# Patient Record
Sex: Female | Born: 1945 | Race: White | Hispanic: No | Marital: Married | Smoking: Never smoker
Health system: Southern US, Community
[De-identification: ages and names within clinical notes are randomized; demographics above are authoritative.]

## PROBLEM LIST (undated history)

## (undated) DIAGNOSIS — I10 Essential (primary) hypertension: Secondary | ICD-10-CM

## (undated) DIAGNOSIS — J309 Allergic rhinitis, unspecified: Secondary | ICD-10-CM

## (undated) DIAGNOSIS — M1990 Unspecified osteoarthritis, unspecified site: Secondary | ICD-10-CM

## (undated) HISTORY — DX: Essential (primary) hypertension: I10

## (undated) HISTORY — DX: Unspecified osteoarthritis, unspecified site: M19.90

## (undated) HISTORY — DX: Allergic rhinitis, unspecified: J30.9

## (undated) HISTORY — PX: DILATION AND CURETTAGE OF UTERUS: SHX78

---

## 1998-11-10 ENCOUNTER — Other Ambulatory Visit: Admission: RE | Admit: 1998-11-10 | Discharge: 1998-11-10 | Payer: Self-pay | Admitting: *Deleted

## 2002-07-15 ENCOUNTER — Encounter: Admission: RE | Admit: 2002-07-15 | Discharge: 2002-07-15 | Payer: Self-pay | Admitting: *Deleted

## 2002-07-15 ENCOUNTER — Encounter: Payer: Self-pay | Admitting: *Deleted

## 2002-07-16 ENCOUNTER — Other Ambulatory Visit: Admission: RE | Admit: 2002-07-16 | Discharge: 2002-07-16 | Payer: Self-pay | Admitting: *Deleted

## 2002-08-19 ENCOUNTER — Other Ambulatory Visit: Admission: RE | Admit: 2002-08-19 | Discharge: 2002-08-19 | Payer: Self-pay | Admitting: *Deleted

## 2006-11-08 ENCOUNTER — Ambulatory Visit: Payer: Self-pay | Admitting: Family Medicine

## 2006-11-08 LAB — CONVERTED CEMR LAB
BUN: 9 mg/dL (ref 6–23)
CO2: 26 meq/L (ref 19–32)
Calcium: 9.9 mg/dL (ref 8.4–10.5)
Chloride: 103 meq/L (ref 96–112)
Chol/HDL Ratio, serum: 5.1
Cholesterol: 256 mg/dL (ref 0–200)
Creatinine, Ser: 0.8 mg/dL (ref 0.4–1.2)
GFR calc non Af Amer: 78 mL/min
Glomerular Filtration Rate, Af Am: 95 mL/min/{1.73_m2}
Glucose, Bld: 87 mg/dL (ref 70–99)
HDL: 50.1 mg/dL (ref >39.0)
LDL DIRECT: 174.8 mg/dL
Potassium: 4 meq/L (ref 3.5–5.1)
Sodium: 138 meq/L (ref 135–145)
Triglyceride fasting, serum: 141 mg/dL (ref 0–149)
VLDL: 28 mg/dL (ref 0–40)

## 2007-11-06 ENCOUNTER — Telehealth (INDEPENDENT_AMBULATORY_CARE_PROVIDER_SITE_OTHER): Payer: Self-pay | Admitting: *Deleted

## 2007-11-16 ENCOUNTER — Telehealth (INDEPENDENT_AMBULATORY_CARE_PROVIDER_SITE_OTHER): Payer: Self-pay | Admitting: *Deleted

## 2007-11-16 ENCOUNTER — Ambulatory Visit: Payer: Self-pay | Admitting: Family Medicine

## 2007-11-16 DIAGNOSIS — I10 Essential (primary) hypertension: Secondary | ICD-10-CM | POA: Insufficient documentation

## 2007-11-16 DIAGNOSIS — M1990 Unspecified osteoarthritis, unspecified site: Secondary | ICD-10-CM | POA: Insufficient documentation

## 2007-11-16 DIAGNOSIS — E782 Mixed hyperlipidemia: Secondary | ICD-10-CM | POA: Insufficient documentation

## 2007-11-16 LAB — CONVERTED CEMR LAB
ALT: 25 units/L (ref 0–35)
AST: 23 units/L (ref 0–37)
BUN: 12 mg/dL (ref 6–23)
CO2: 26 meq/L (ref 19–32)
Calcium: 9.5 mg/dL (ref 8.4–10.5)
Chloride: 105 meq/L (ref 96–112)
Cholesterol: 240 mg/dL (ref 0–200)
Creatinine, Ser: 0.8 mg/dL (ref 0.4–1.2)
Direct LDL: 162.7 mg/dL
GFR calc Af Amer: 94 mL/min
GFR calc non Af Amer: 78 mL/min
Glucose, Bld: 101 mg/dL — ABNORMAL HIGH (ref 70–99)
HDL: 38.3 mg/dL — ABNORMAL LOW (ref >39.0)
Potassium: 4 meq/L (ref 3.5–5.1)
Sodium: 139 meq/L (ref 135–145)
Total CHOL/HDL Ratio: 6.3
Triglycerides: 129 mg/dL (ref 0–149)
VLDL: 26 mg/dL (ref 0–40)

## 2008-01-23 ENCOUNTER — Ambulatory Visit: Payer: Self-pay | Admitting: Family Medicine

## 2008-01-29 DIAGNOSIS — R7309 Other abnormal glucose: Secondary | ICD-10-CM | POA: Insufficient documentation

## 2008-01-29 LAB — CONVERTED CEMR LAB
ALT: 23 units/L (ref 0–35)
AST: 21 units/L (ref 0–37)
BUN: 12 mg/dL (ref 6–23)
CO2: 28 meq/L (ref 19–32)
Calcium: 9.7 mg/dL (ref 8.4–10.5)
Chloride: 105 meq/L (ref 96–112)
Cholesterol: 225 mg/dL (ref 0–200)
Creatinine, Ser: 0.9 mg/dL (ref 0.4–1.2)
Direct LDL: 152.4 mg/dL
GFR calc Af Amer: 82 mL/min
GFR calc non Af Amer: 68 mL/min
Glucose, Bld: 114 mg/dL — ABNORMAL HIGH (ref 70–99)
HDL: 42.8 mg/dL (ref >39.0)
Potassium: 3.9 meq/L (ref 3.5–5.1)
Sodium: 140 meq/L (ref 135–145)
Total CHOL/HDL Ratio: 5.3
Triglycerides: 170 mg/dL — ABNORMAL HIGH (ref 0–149)
VLDL: 34 mg/dL (ref 0–40)

## 2008-01-30 ENCOUNTER — Encounter (INDEPENDENT_AMBULATORY_CARE_PROVIDER_SITE_OTHER): Payer: Self-pay | Admitting: *Deleted

## 2008-02-01 ENCOUNTER — Telehealth (INDEPENDENT_AMBULATORY_CARE_PROVIDER_SITE_OTHER): Payer: Self-pay | Admitting: *Deleted

## 2012-01-18 ENCOUNTER — Ambulatory Visit (INDEPENDENT_AMBULATORY_CARE_PROVIDER_SITE_OTHER): Payer: 59 | Admitting: Family Medicine

## 2012-01-18 VITALS — BP 143/79 | HR 82 | Temp 98.0°F | Resp 16 | Ht 60.0 in | Wt 177.0 lb

## 2012-01-18 DIAGNOSIS — R0982 Postnasal drip: Secondary | ICD-10-CM

## 2012-01-18 DIAGNOSIS — E7800 Pure hypercholesterolemia, unspecified: Secondary | ICD-10-CM

## 2012-01-18 DIAGNOSIS — I10 Essential (primary) hypertension: Secondary | ICD-10-CM

## 2012-01-18 LAB — POCT CBC
Granulocyte percent: 55.8 %G (ref 37–80)
HCT, POC: 46.6 % (ref 37.7–47.9)
Hemoglobin: 15.2 g/dL (ref 12.2–16.2)
Lymph, poc: 3.1 (ref 0.6–3.4)
MCH, POC: 29.2 pg (ref 27–31.2)
MCHC: 32.6 g/dL (ref 31.8–35.4)
MCV: 89.7 fL (ref 80–97)
MPV: 10 fL (ref 0–99.8)
POC Granulocyte: 4.7 (ref 2–6.9)
POC LYMPH PERCENT: 36.9 %L (ref 10–50)
POC MID %: 7.3 %M (ref 0–12)
RBC: 5.2 M/uL (ref 4.04–5.48)
RDW, POC: 14 %
WBC: 8.5 10*3/uL (ref 4.6–10.2)

## 2012-01-18 LAB — LIPID PANEL
Cholesterol: 263 mg/dL — ABNORMAL HIGH (ref 0–200)
HDL: 54 mg/dL (ref >39)
LDL Cholesterol: 176 mg/dL — ABNORMAL HIGH (ref 0–99)
Total CHOL/HDL Ratio: 4.9 Ratio
Triglycerides: 164 mg/dL — ABNORMAL HIGH (ref <150)
VLDL: 33 mg/dL (ref 0–40)

## 2012-01-18 LAB — COMPREHENSIVE METABOLIC PANEL
ALT: 33 U/L (ref 0–35)
AST: 40 U/L — ABNORMAL HIGH (ref 0–37)
Albumin: 4.3 g/dL (ref 3.5–5.2)
Alkaline Phosphatase: 92 U/L (ref 39–117)
CO2: 24 mEq/L (ref 19–32)
Calcium: 9.7 mg/dL (ref 8.4–10.5)
Chloride: 106 mEq/L (ref 96–112)
Creat: 0.89 mg/dL (ref 0.50–1.10)
Glucose, Bld: 104 mg/dL — ABNORMAL HIGH (ref 70–99)
Potassium: 4.3 mEq/L (ref 3.5–5.3)
Sodium: 142 mEq/L (ref 135–145)
Total Bilirubin: 0.7 mg/dL (ref 0.3–1.2)
Total Protein: 7.2 g/dL (ref 6.0–8.3)

## 2012-01-18 MED ORDER — IPRATROPIUM BROMIDE 0.03 % NA SOLN: 2.0000 | Freq: Four times a day (QID) | NASAL | Status: DC

## 2012-01-18 MED ORDER — LISINOPRIL 40 MG PO TABS: 40.0000 mg | ORAL_TABLET | Freq: Every day | ORAL | Status: DC

## 2012-01-18 NOTE — Progress Notes (Signed)
  Patient Name: Sally Bautista Date of Birth: Jul 30, 1946 Medical Record Number: [id number] Gender: female Date of Encounter: 01/18/2012  History of Present Illness:  Sally Bautista is a 66 y.o. very pleasant female patient who presents with the following:  Here for BP check and labs/ medication refill.  Has been taking her lisinopril regularly.  Does not do home checks.  Did take her medication today, but she is otherwise fasting today.  Does also have complaint of "constant sinus drainage."  She has tried saline spray OTC, hot tea.  Notes that her nose is stuffy and she has PND  She is not taking cholesterol medicaiton.  She has not been able to tolerate statins due to muscle aches.  She also has tried zetia but also had muscle pains.    Patient Active Problem List  Diagnoses  . HYPERLIPIDEMIA  . HYPERTENSION  . OSTEOARTHRITIS  . HYPERGLYCEMIA, FASTING   History reviewed. No pertinent past medical history. Past Surgical History  Procedure Date  . Cesarean section   . Dilation and curettage of uterus     times 2   History  Substance Use Topics  . Smoking status: Never Smoker   . Smokeless tobacco: Never Used  . Alcohol Use: No   Family History  Problem Relation Age of Onset  . Arthritis Mother   . Diabetes Brother   . Asthma Daughter    Allergies  Allergen Reactions  . Sulfonamide Derivatives     Medication list has been reviewed and updated.  Review of Systems: As per HPI- otherwise negative.  Physical Examination: Filed Vitals:   01/18/12 0853  BP: 143/79  Pulse: 82  Temp: 98 F (36.7 C)  Resp: 16  Height: 5' (1.524 m)  Weight: 177 lb (80.287 kg)    Body mass index is 34.57 kg/(m^2).  GEN: WDWN, NAD, Non-toxic, A & O x 3 HEENT: Atraumatic, Normocephalic. Neck supple. No masses, No LAD. Ears and Nose: No external deformity. CV: RRR, No M/G/R. No JVD. No thrill. No extra heart sounds. PULM: CTA B, no wheezes, crackles, rhonchi. No retractions. No resp.  distress. No accessory muscle use. ABD: S, NT, ND, +BS. No rebound. No HSM. EXTR: No c/c/e NEURO Normal gait.  PSYCH: Normally interactive. Conversant. Not depressed or anxious appearing.  Calm demeanor.    Assessment and Plan: 1. Hypertension  Comprehensive metabolic panel, POCT CBC, lisinopril (PRINIVIL,ZESTRIL) 40 MG tablet  2. Hypercholesterolemia  Lipid panel  3. Post-nasal drip  ipratropium (ATROVENT) 0.03 % nasal spray   She will try OTC niacin as she has been able to tolerate this in the past.  Further follow up pending labs

## 2012-01-19 ENCOUNTER — Encounter: Payer: Self-pay | Admitting: Family Medicine

## 2012-11-19 ENCOUNTER — Ambulatory Visit (INDEPENDENT_AMBULATORY_CARE_PROVIDER_SITE_OTHER): Payer: BC Managed Care – PPO | Admitting: Family Medicine

## 2012-11-19 VITALS — BP 168/76 | HR 77 | Temp 97.8°F | Resp 16 | Ht 60.0 in | Wt 175.0 lb

## 2012-11-19 DIAGNOSIS — J329 Chronic sinusitis, unspecified: Secondary | ICD-10-CM

## 2012-11-19 DIAGNOSIS — I10 Essential (primary) hypertension: Secondary | ICD-10-CM

## 2012-11-19 MED ORDER — AMOXICILLIN 500 MG PO CAPS: 1000.0000 mg | ORAL_CAPSULE | Freq: Two times a day (BID) | ORAL | Status: DC

## 2012-11-19 MED ORDER — LISINOPRIL 40 MG PO TABS: 40.0000 mg | ORAL_TABLET | Freq: Every day | ORAL | Status: DC

## 2012-11-19 NOTE — Patient Instructions (Addendum)
Use the autobiotic as directed.  Let me know if you are not better in the next few days- Sooner if worse.   Keep an eye on your blood pressure- you can check it at most drug stores.  If it is running more than 140/ 85 consistently please let me know

## 2012-11-19 NOTE — Progress Notes (Signed)
[facility] [address] [place] [address] [phone]  Date:  11/19/2012   Name:  Sally Bautista   DOB:  [birth year]   MRN:  [id number]  PCP:  Dois Davenport., MD    Chief Complaint: Cough and Nasal Congestion   History of Present Illness:  Sally Bautista is a 66 y.o. very pleasant female patient who presents with the following:  Here today for illness- she has noted sinus congestion for "several months" and she has tried several OTC medications.  She is blowing out thick mucus.  She just started coughing over the last couple of days and is coughing up some thick mucus.  She thinks the infection is draining down her throat.  She has not noted a fever but has had chills, and was sweating this morning.    No GI symptoms.   She has had a slight ST, ear congestion.    She has been exposed to sick people at home- grandchildren, DIL, brother.    She did start an OTC decongestant which she thinks has raised her BP  Patient Active Problem List  Diagnosis  . HYPERLIPIDEMIA  . HYPERTENSION  . OSTEOARTHRITIS  . HYPERGLYCEMIA, FASTING    Past Medical History  Diagnosis Date  . Allergic rhinitis, cause unspecified     Allergic rhinitis  . Arthritis   . HTN (hypertension)     Past Surgical History  Procedure Date  . Cesarean section   . Dilation and curettage of uterus     times 2    History  Substance Use Topics  . Smoking status: Never Smoker   . Smokeless tobacco: Never Used  . Alcohol Use: No    Family History  Problem Relation Age of Onset  . Arthritis Mother   . Diabetes Brother   . Asthma Daughter     Allergies  Allergen Reactions  . Codeine Nausea And Vomiting  . Sulfonamide Derivatives     Medication list has been reviewed and updated.  Current Outpatient Prescriptions on File Prior to Visit  Medication Sig Dispense Refill  . aspirin 81 MG tablet Take 81 mg by mouth daily.      . diphenhydrAMINE (BENADRYL) 25 mg capsule  Take 25 mg by mouth every 6 (six) hours as needed.      Marland Kitchen lisinopril (PRINIVIL,ZESTRIL) 40 MG tablet Take 1 tablet (40 mg total) by mouth daily.  90 tablet  3  . calcium carbonate (OS-CAL) 1250 MG chewable tablet Chew 1 tablet by mouth daily.      Marland Kitchen ipratropium (ATROVENT) 0.03 % nasal spray Place 2 sprays into the nose 4 (four) times daily.  30 mL  6    Review of Systems:  As per HPI- otherwise negative.   Physical Examination: Filed Vitals:   11/19/12 1004  BP: 168/76  Pulse: 77  Temp: 97.8 F (36.6 C)  Resp: 16   Filed Vitals:   11/19/12 1004  Height: 5' (1.524 m)  Weight: 175 lb (79.379 kg)   Body mass index is 34.18 kg/(m^2). Ideal Body Weight: Weight in (lb) to have BMI = 25: 127.7   GEN: WDWN, NAD, Non-toxic, A & O x 3 HEENT: Atraumatic, Normocephalic. Neck supple. No masses, No LAD. Bilateral TM wnl, oropharynx normal.  PEERL,EOMI.  She has nasal congestion and sinus tenderness  Ears and Nose: No external deformity. CV: RRR, No M/G/R. No JVD. No thrill. No extra heart sounds. PULM: CTA B, no wheezes, crackles,  rhonchi. No retractions. No resp. distress. No accessory muscle use. ABD: S, NT, ND, +BS. No rebound. No HSM. EXTR: No c/c/e NEURO Normal gait.  PSYCH: Normally interactive. Conversant. Not depressed or anxious appearing.  Calm demeanor.    Assessment and Plan: 1. Hypertension  lisinopril (PRINIVIL,ZESTRIL) 40 MG tablet  2. Sinusitis  amoxicillin (AMOXIL) 500 MG capsule   Treat for sinusitis as above.  Refilled her BP medication.  Her BP is up due to OTC decongestants.  She will monitor her BP- let me know if high persistently  See patient instructions for more details.    Meds ordered this encounter  Medications  . diphenhydrAMINE (BENADRYL) 25 mg capsule    Sig: Take 25 mg by mouth every 6 (six) hours as needed.  Marland Kitchen lisinopril (PRINIVIL,ZESTRIL) 40 MG tablet    Sig: Take 1 tablet (40 mg total) by mouth daily.    Dispense:  90 tablet    Refill:  3    . amoxicillin (AMOXIL) 500 MG capsule    Sig: Take 2 capsules (1,000 mg total) by mouth 2 (two) times daily.    Dispense:  40 capsule    Refill:  0     COPLAND,JESSICA, MD

## 2012-11-22 ENCOUNTER — Ambulatory Visit: Payer: BC Managed Care – PPO

## 2012-11-22 ENCOUNTER — Ambulatory Visit (INDEPENDENT_AMBULATORY_CARE_PROVIDER_SITE_OTHER): Payer: BC Managed Care – PPO | Admitting: Family Medicine

## 2012-11-22 VITALS — BP 165/85 | HR 89 | Temp 97.5°F | Resp 16 | Ht 60.0 in | Wt 173.0 lb

## 2012-11-22 DIAGNOSIS — R059 Cough, unspecified: Secondary | ICD-10-CM

## 2012-11-22 DIAGNOSIS — R062 Wheezing: Secondary | ICD-10-CM

## 2012-11-22 DIAGNOSIS — R05 Cough: Secondary | ICD-10-CM

## 2012-11-22 DIAGNOSIS — M4850XA Collapsed vertebra, not elsewhere classified, site unspecified, initial encounter for fracture: Secondary | ICD-10-CM

## 2012-11-22 DIAGNOSIS — J45909 Unspecified asthma, uncomplicated: Secondary | ICD-10-CM

## 2012-11-22 LAB — POCT CBC
Granulocyte percent: 54.4 %G (ref 37–80)
MCH, POC: 29.8 pg (ref 27–31.2)
MCV: 92.8 fL (ref 80–97)
MID (cbc): 0.7 (ref 0–0.9)
MPV: 10.3 fL (ref 0–99.8)
POC LYMPH PERCENT: 36.3 %L (ref 10–50)
POC MID %: 9.3 %M (ref 0–12)
Platelet Count, POC: 309 10*3/uL (ref 142–424)
RDW, POC: 12.4 %
WBC: 7.9 10*3/uL (ref 4.6–10.2)

## 2012-11-22 MED ORDER — ALBUTEROL SULFATE (2.5 MG/3ML) 0.083% IN NEBU: 2.5000 mg | INHALATION_SOLUTION | Freq: Once | RESPIRATORY_TRACT | Status: DC

## 2012-11-22 MED ORDER — ALBUTEROL SULFATE HFA 108 (90 BASE) MCG/ACT IN AERS: 2.0000 | INHALATION_SPRAY | Freq: Four times a day (QID) | RESPIRATORY_TRACT | Status: DC | PRN

## 2012-11-22 MED ORDER — PREDNISONE 20 MG PO TABS: ORAL_TABLET | ORAL | Status: DC

## 2012-11-22 MED ORDER — HYDROCODONE-HOMATROPINE 5-1.5 MG/5ML PO SYRP: 5.0000 mL | ORAL_SOLUTION | ORAL | Status: DC | PRN

## 2012-11-22 NOTE — Patient Instructions (Signed)
Drink lots of fluids  Use the inhaler 2 puffs every 4-6 hours as needed for wheezing and coughing. I would like you to use it faithfully for the first few days.  Take the cough syrup when needed, primarily at bedtime. It will make you drowsy in the daytime probably.  Continue the antibiotics

## 2012-11-22 NOTE — Addendum Note (Signed)
Addended by: Randell Teare H on: 11/22/2012 04:19 PM   Modules accepted: Orders

## 2012-11-22 NOTE — Progress Notes (Signed)
Subjective: Patient is here with a followup from her visit a few days ago. She saw Dr. Dallas Schimke and was treated with amoxicillin for a sinusitis. She is continued to have some drainage from her nose which is white to yellow to clear. Coughing a lot, especially at night. Last night she hardly got any sleep. She does not smoke. They steadfastly avoid perfume says her daughter has so much allergies. She works for [employer] for [employer] and works in the basement of [employer]. She has not been feverish.  Objective: Pleasant alert lady who looks a little uncomfortable. Her nose is red from blowing and wiping. More inflation the left nares than the right. Her TMs are normal. Her throat her neck was supple without nodes. Her chest has scattered wheezing bilaterally. Heart regular without murmurs.  Assessment: Asthmatic bronchitis Rhinosinusitis  Plan: Get peak flow Chest x-ray CBC Breathing treatment with albuterol  [facility] reading (PRIMARY) by  Dr. Marland Kitchenhopper Normal cxr  Peak flow only went from 280 up to 300 after albuterol treatment. However the lungs sound much better.  Will treat as an asthmatic bronchitis. Return if worse.
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
## 2012-12-05 ENCOUNTER — Ambulatory Visit (INDEPENDENT_AMBULATORY_CARE_PROVIDER_SITE_OTHER): Payer: BC Managed Care – PPO | Admitting: Family Medicine

## 2012-12-05 VITALS — BP 136/74 | HR 74 | Temp 98.0°F | Resp 18 | Ht 60.0 in | Wt 174.0 lb

## 2012-12-05 DIAGNOSIS — R059 Cough, unspecified: Secondary | ICD-10-CM

## 2012-12-05 DIAGNOSIS — J45909 Unspecified asthma, uncomplicated: Secondary | ICD-10-CM

## 2012-12-05 DIAGNOSIS — R05 Cough: Secondary | ICD-10-CM

## 2012-12-05 DIAGNOSIS — J019 Acute sinusitis, unspecified: Secondary | ICD-10-CM

## 2012-12-05 DIAGNOSIS — J40 Bronchitis, not specified as acute or chronic: Secondary | ICD-10-CM

## 2012-12-05 MED ORDER — MOXIFLOXACIN HCL 400 MG PO TABS: 400.0000 mg | ORAL_TABLET | Freq: Every day | ORAL | Status: DC

## 2012-12-05 NOTE — Progress Notes (Signed)
[facility]:  Office Visit  Chief Complaint:  Chief Complaint  Patient presents with  . Follow-up    from 11/22/12- Pt felt a little better and is now getting worse    HPI: Sally Bautista is a 67 y.o. female who complains of  sxs of a  sinus infection. She was recently treated on 12/30 with amoxacillin for acute sinusitis and then return on 1/2 for asthmatic bronchitis and was treated with prednisone and inhaler.. She states she is still having  thick green drainage from her nose, coughing up thick green to clear . Sinus pressure. Had mild HA today but now gone. Using Afrin, Simply Saline. Mucinex. No fevers, + chills.   Past Medical History  Diagnosis Date  . Allergic rhinitis, cause unspecified     Allergic rhinitis  . Arthritis   . HTN (hypertension)    Past Surgical History  Procedure Date  . Cesarean section   . Dilation and curettage of uterus     times 2   History   Social History  . Marital Status: Married    Spouse Name: N/A    Number of Children: N/A  . Years of Education: N/A   Social History Main Topics  . Smoking status: Never Smoker   . Smokeless tobacco: Never Used  . Alcohol Use: No  . Drug Use: No  . Sexually Active: None   Other Topics Concern  . None   Social History Narrative  . None   Family History  Problem Relation Age of Onset  . Arthritis Mother   . Diabetes Brother   . Asthma Daughter    Allergies  Allergen Reactions  . Codeine Nausea And Vomiting  . Sulfonamide Derivatives    Prior to Admission medications   Medication Sig Start Date End Date Taking? Authorizing Provider  albuterol (PROVENTIL HFA;VENTOLIN HFA) 108 (90 BASE) MCG/ACT inhaler Inhale 2 puffs into the lungs every 6 (six) hours as needed for wheezing. 11/22/12  Yes Peyton Najjar, MD  aspirin 81 MG tablet Take 81 mg by mouth daily.   Yes Historical Provider, MD  lisinopril (PRINIVIL,ZESTRIL) 40 MG tablet Take 1 tablet (40 mg total) by mouth daily.  11/19/12  Yes Gwenlyn Found Copland, MD  loratadine (CLARITIN) 10 MG tablet Take 10 mg by mouth daily.   Yes Historical Provider, MD  amoxicillin (AMOXIL) 500 MG capsule Take 2 capsules (1,000 mg total) by mouth 2 (two) times daily. 11/19/12   Gwenlyn Found Copland, MD  calcium carbonate (OS-CAL) 1250 MG chewable tablet Chew 1 tablet by mouth daily.    Historical Provider, MD  diphenhydrAMINE (BENADRYL) 25 mg capsule Take 25 mg by mouth every 6 (six) hours as needed.    Historical Provider, MD  HYDROcodone-homatropine (HYCODAN) 5-1.5 MG/5ML syrup Take 5 mLs by mouth every 4 (four) hours as needed for cough. 11/22/12   Peyton Najjar, MD  ipratropium (ATROVENT) 0.03 % nasal spray Place 2 sprays into the nose 4 (four) times daily. 01/18/12 01/17/13  Pearline Cables, MD  predniSONE (DELTASONE) 20 MG tablet a 11/22/12   Peyton Najjar, MD     ROS: The patient denies  night sweats, unintentional weight loss, chest pain, palpitations, wheezing, dyspnea on exertion, nausea, vomiting, abdominal pain, dysuria, hematuria, melena, numbness, weakness, or tingling.  All other systems have been reviewed and were otherwise negative with the exception of those mentioned in the HPI and as above.    PHYSICAL EXAM: Filed Vitals:  12/05/12 1617  BP: 136/74  Pulse: 74  Temp: 98 F (36.7 C)  Resp: 18  Spo2                   96% Filed Vitals:   12/05/12 1617  Height: 5' (1.524 m)  Weight: 174 lb (78.926 kg)   Body mass index is 33.98 kg/(m^2).  General: Alert, no acute distress HEENT:  Normocephalic, atraumatic, oropharynx patent. +bilateral frontal  sinus tenderness. TM nl, Erythematous throat. Boggy nares Cardiovascular:  Regular rate and rhythm, no rubs murmurs or gallops.  No Carotid bruits, radial pulse intact. No pedal edema.  Respiratory: Clear to auscultation bilaterally.  No wheezes, rales, or rhonchi.  No cyanosis, no use of accessory musculature GI: No organomegaly, abdomen is soft and non-tender,  positive bowel sounds.  No masses. Skin: No rashes. Neurologic: Facial musculature symmetric. Psychiatric: Patient is appropriate throughout our interaction. Lymphatic: No cervical lymphadenopathy Musculoskeletal: Gait intact.   LABS: Results for orders placed in visit on 11/22/12  POCT CBC      Component Value Range   WBC 7.9  4.6 - 10.2 K/uL   Lymph, poc 2.9  0.6 - 3.4   POC LYMPH PERCENT 36.3  10 - 50 %L   MID (cbc) 0.7  0 - 0.9   POC MID % 9.3  0 - 12 %M   POC Granulocyte 4.3  2 - 6.9   Granulocyte percent 54.4  37 - 80 %G   RBC 5.26  4.04 - 5.48 M/uL   Hemoglobin 15.7  12.2 - 16.2 g/dL   HCT, POC 40.9 (*) 81.1 - 47.9 %   MCV 92.8  80 - 97 fL   MCH, POC 29.8  27 - 31.2 pg   MCHC 32.2  31.8 - 35.4 g/dL   RDW, POC 91.4     Platelet Count, POC 309  142 - 424 K/uL   MPV 10.3  0 - 99.8 fL     EKG/XRAY:   Primary read interpreted by Dr. Conley Rolls at [facility].   ASSESSMENT/PLAN: Encounter Diagnoses  Name Primary?  . Acute sinusitis Yes  . Bronchitis    Patient is here after initial treatment with augmentin x 10 days did not completely improve sxs. She also was treated for asthmatic bronchitis.  I have d/w patient abx use in setting of viral vs bacterial infection. She would like an antibiotic Rx Avelox  C/w Albuterol INH prn, nsal sprays, mucinex F/u prn   Leara Rawl PHUONG, DO 12/05/2012 5:52 PM

## 2012-12-11 ENCOUNTER — Ambulatory Visit (HOSPITAL_COMMUNITY)
Admission: RE | Admit: 2012-12-11 | Discharge: 2012-12-11 | Disposition: A | Discharge status: Home / Self Care | Payer: BC Managed Care – PPO | Source: Ambulatory Visit | Attending: Family Medicine | Admitting: Family Medicine

## 2012-12-11 DIAGNOSIS — Z13820 Encounter for screening for osteoporosis: Secondary | ICD-10-CM | POA: Insufficient documentation

## 2012-12-11 DIAGNOSIS — Z8781 Personal history of (healed) traumatic fracture: Secondary | ICD-10-CM | POA: Insufficient documentation

## 2012-12-11 DIAGNOSIS — M4850XA Collapsed vertebra, not elsewhere classified, site unspecified, initial encounter for fracture: Secondary | ICD-10-CM

## 2013-02-15 ENCOUNTER — Telehealth: Payer: Self-pay | Admitting: *Deleted

## 2013-02-15 NOTE — Telephone Encounter (Signed)
Pt called regarding her DEXA scan can we please review for her?

## 2013-02-15 NOTE — Telephone Encounter (Signed)
Dr. Alwyn Ren, do you want to initiate therapy?

## 2013-02-15 NOTE — Telephone Encounter (Signed)
Osteopenia (thinning but not osteoporosis).    Rx:  alendronate 35 mg #13 Take one weekly on Saturdays, in AM before eating with a glass of water. Wait 30 min before eating, and stay upright  (not lying down or reclining) for 30 minutes.  RFX 3   Return 1 yr.

## 2013-02-18 MED ORDER — ALENDRONATE SODIUM 35 MG PO TABS: 35.0000 mg | ORAL_TABLET | ORAL | Status: DC

## 2013-02-18 NOTE — Telephone Encounter (Signed)
Patient advised.

## 2013-02-18 NOTE — Telephone Encounter (Signed)
Called patient to advise. Left message for her to call me back  

## 2013-08-01 ENCOUNTER — Encounter: Payer: Self-pay | Admitting: Family Medicine

## 2013-08-01 ENCOUNTER — Ambulatory Visit (INDEPENDENT_AMBULATORY_CARE_PROVIDER_SITE_OTHER): Payer: BC Managed Care – PPO | Admitting: Family Medicine

## 2013-08-01 VITALS — BP 120/76 | HR 73 | Temp 98.0°F | Resp 16 | Ht 59.5 in | Wt 170.0 lb

## 2013-08-01 DIAGNOSIS — R350 Frequency of micturition: Secondary | ICD-10-CM

## 2013-08-01 DIAGNOSIS — M79609 Pain in unspecified limb: Secondary | ICD-10-CM

## 2013-08-01 DIAGNOSIS — N390 Urinary tract infection, site not specified: Secondary | ICD-10-CM

## 2013-08-01 DIAGNOSIS — M779 Enthesopathy, unspecified: Secondary | ICD-10-CM

## 2013-08-01 LAB — POCT UA - MICROSCOPIC ONLY
Casts, Ur, LPF, POC: NEGATIVE
Crystals, Ur, HPF, POC: NEGATIVE
Mucus, UA: NEGATIVE
Yeast, UA: NEGATIVE

## 2013-08-01 LAB — POCT URINALYSIS DIPSTICK
Bilirubin, UA: NEGATIVE
Blood, UA: NEGATIVE
Glucose, UA: NEGATIVE
Ketones, UA: NEGATIVE
Nitrite, UA: POSITIVE
Protein, UA: NEGATIVE
Spec Grav, UA: 1.015
Urobilinogen, UA: 1
pH, UA: 5

## 2013-08-01 MED ORDER — CIPROFLOXACIN HCL 250 MG PO TABS: 250.0000 mg | ORAL_TABLET | Freq: Two times a day (BID) | ORAL | Status: DC

## 2013-08-01 MED ORDER — PREDNISONE 20 MG PO TABS: ORAL_TABLET | ORAL | Status: DC

## 2013-08-01 NOTE — Progress Notes (Signed)
67 year old woman who comes in with her daughter. She's complaining about urinary frequency the last week or so as well as right foot pain which is going on for urine half. He says the pain is worse when she is bearing weight and occasionally the toes gets stuck in an extended position and just for some back.  Objective: No acute distress, elderly woman who is mildly over weight Examination right foot reveals a fleshy subcutaneous mass over the dorsalis pedal pulse area. She has good pulses. Skin is normal. Range of motion is normal. She is tender over the extensor tendons of her right foot.  Patient has no CVA tenderness  Results for orders placed in visit on 08/01/13  POCT UA - MICROSCOPIC ONLY      Result Value Range   WBC, Ur, HPF, POC 4-6     RBC, urine, microscopic 0-2     Bacteria, U Microscopic tr     Mucus, UA neg     Epithelial cells, urine per micros 1-3     Crystals, Ur, HPF, POC neg     Casts, Ur, LPF, POC neg     Yeast, UA neg    POCT URINALYSIS DIPSTICK      Result Value Range   Color, UA orange     Clarity, UA clear     Glucose, UA neg     Bilirubin, UA neg     Ketones, UA neg     Spec Grav, UA 1.015     Blood, UA neg     pH, UA 5.0     Protein, UA neg     Urobilinogen, UA 1.0     Nitrite, UA pos     Leukocytes, UA Trace     Assessment: Extensor tendinitis of the right foot, urinary tract infection which is uncomplicated  Plan: Urinary frequency - Plan: POCT UA - Microscopic Only, POCT urinalysis dipstick, ciprofloxacin (CIPRO) 250 MG tablet, Urine culture  UTI (urinary tract infection) - Plan: ciprofloxacin (CIPRO) 250 MG tablet, Urine culture  Tendonitis - Plan: predniSONE (DELTASONE) 20 MG tablet  Signed, Elvina Sidle, MD

## 2013-08-01 NOTE — Patient Instructions (Signed)
Tendinitis Tendinitis is swelling and inflammation of the tendons. Tendons are band-like tissues that connect muscle to bone. Tendinitis commonly occurs in the:   Shoulders (rotator cuff).  Heels (Achilles tendon).  Elbows (triceps tendon). CAUSES Tendinitis is usually caused by overusing the tendon, muscles, and joints involved. When the tissue surrounding a tendon (synovium) becomes inflamed, it is called tenosynovitis. Tendinitis commonly develops in people whose jobs require repetitive motions. SYMPTOMS  Pain.  Tenderness.  Mild swelling. DIAGNOSIS Tendinitis is usually diagnosed by physical exam. Your caregiver may also order X-rays or other imaging tests. TREATMENT Your caregiver may recommend certain medicines or exercises for your treatment. HOME CARE INSTRUCTIONS   Use a sling or splint for as long as directed by your caregiver until the pain decreases.  Put ice on the injured area.  Put ice in a plastic bag.  Place a towel between your skin and the bag.  Leave the ice on for 15-20 minutes, 3-4 times a day.  Avoid using the limb while the tendon is painful. Perform gentle range of motion exercises only as directed by your caregiver. Stop exercises if pain or discomfort increase, unless directed otherwise by your caregiver.  Only take over-the-counter or prescription medicines for pain, discomfort, or fever as directed by your caregiver. SEEK MEDICAL CARE IF:   Your pain and swelling increase.  You develop new, unexplained symptoms, especially increased numbness in the hands. MAKE SURE YOU:   Understand these instructions.  Will watch your condition.  Will get help right away if you are not doing well or get worse. Document Released: 11/04/2000 Document Revised: 01/30/2012 Document Reviewed: 01/24/2011 [facility] Patient Information 2014 [place], [place]. Urinary Tract Infection Urinary tract infections (UTIs) can develop anywhere along your urinary tract. Your  urinary tract is your body's drainage system for removing wastes and extra water. Your urinary tract includes two kidneys, two ureters, a bladder, and a urethra. Your kidneys are a pair of bean-shaped organs. Each kidney is about the size of your fist. They are located below your ribs, one on each side of your spine. CAUSES Infections are caused by microbes, which are microscopic organisms, including fungi, viruses, and bacteria. These organisms are so small that they can only be seen through a microscope. Bacteria are the microbes that most commonly cause UTIs. SYMPTOMS  Symptoms of UTIs may vary by age and gender of the patient and by the location of the infection. Symptoms in young women typically include a frequent and intense urge to urinate and a painful, burning feeling in the bladder or urethra during urination. Older women and men are more likely to be tired, shaky, and weak and have muscle aches and abdominal pain. A fever may mean the infection is in your kidneys. Other symptoms of a kidney infection include pain in your back or sides below the ribs, nausea, and vomiting. DIAGNOSIS To diagnose a UTI, your caregiver will ask you about your symptoms. Your caregiver also will ask to provide a urine sample. The urine sample will be tested for bacteria and white blood cells. White blood cells are made by your body to help fight infection. TREATMENT  Typically, UTIs can be treated with medication. Because most UTIs are caused by a bacterial infection, they usually can be treated with the use of antibiotics. The choice of antibiotic and length of treatment depend on your symptoms and the type of bacteria causing your infection. HOME CARE INSTRUCTIONS  If you were prescribed antibiotics, take them exactly as your  caregiver instructs you. Finish the medication even if you feel better after you have only taken some of the medication.  Drink enough water and fluids to keep your urine clear or pale  yellow.  Avoid caffeine, tea, and carbonated beverages. They tend to irritate your bladder.  Empty your bladder often. Avoid holding urine for long periods of time.  Empty your bladder before and after sexual intercourse.  After a bowel movement, women should cleanse from front to back. Use each tissue only once. SEEK MEDICAL CARE IF:   You have back pain.  You develop a fever.  Your symptoms do not begin to resolve within 3 days. SEEK IMMEDIATE MEDICAL CARE IF:   You have severe back pain or lower abdominal pain.  You develop chills.  You have nausea or vomiting.  You have continued burning or discomfort with urination. MAKE SURE YOU:   Understand these instructions.  Will watch your condition.  Will get help right away if you are not doing well or get worse. Document Released: 08/17/2005 Document Revised: 05/08/2012 Document Reviewed: 12/16/2011 [facility] Patient Information 2014 [place], [place].

## 2013-08-03 LAB — URINE CULTURE
Colony Count: NO GROWTH
Organism ID, Bacteria: NO GROWTH

## 2013-09-05 ENCOUNTER — Encounter: Payer: Self-pay | Admitting: *Deleted

## 2013-11-30 ENCOUNTER — Ambulatory Visit (INDEPENDENT_AMBULATORY_CARE_PROVIDER_SITE_OTHER): Payer: BC Managed Care – PPO | Admitting: Emergency Medicine

## 2013-11-30 VITALS — BP 142/70 | HR 84 | Temp 98.6°F | Resp 18 | Ht 60.5 in | Wt 173.0 lb

## 2013-11-30 DIAGNOSIS — Z1211 Encounter for screening for malignant neoplasm of colon: Secondary | ICD-10-CM

## 2013-11-30 DIAGNOSIS — E7800 Pure hypercholesterolemia, unspecified: Secondary | ICD-10-CM

## 2013-11-30 DIAGNOSIS — I10 Essential (primary) hypertension: Secondary | ICD-10-CM

## 2013-11-30 DIAGNOSIS — Z1239 Encounter for other screening for malignant neoplasm of breast: Secondary | ICD-10-CM

## 2013-11-30 MED ORDER — LISINOPRIL 40 MG PO TABS: 40.0000 mg | ORAL_TABLET | Freq: Every day | ORAL | Status: DC

## 2013-11-30 NOTE — Progress Notes (Signed)
[facility] [address] [place] [address] [phone]  Date:  11/30/2013   Name:  Sally Bautista   DOB:  08-31-1946   MRN:  [id number]  PCP:  Dois Davenport., MD    Chief Complaint: rx refills   History of Present Illness:  Sally Bautista is a 68 y.o. very pleasant female patient who presents with the following:  History of hypertension and requests refill on lisinopril.  Says she has trouble swallowing pills.  Never had colonoscopy.  "long time" since mammogram.  Denies other complaint or health concern today.   Patient Active Problem List   Diagnosis Date Noted  . HYPERGLYCEMIA, FASTING 01/29/2008  . HYPERLIPIDEMIA 11/16/2007  . HYPERTENSION 11/16/2007  . OSTEOARTHRITIS 11/16/2007    Past Medical History  Diagnosis Date  . Allergic rhinitis, cause unspecified     Allergic rhinitis  . Arthritis   . HTN (hypertension)     Past Surgical History  Procedure Laterality Date  . Cesarean section    . Dilation and curettage of uterus      times 2    History  Substance Use Topics  . Smoking status: Never Smoker   . Smokeless tobacco: Never Used  . Alcohol Use: No    Family History  Problem Relation Age of Onset  . Arthritis Mother   . Diabetes Brother   . Asthma Daughter     Allergies  Allergen Reactions  . Codeine Nausea And Vomiting  . Sulfonamide Derivatives     Medication list has been reviewed and updated.  Current Outpatient Prescriptions on File Prior to Visit  Medication Sig Dispense Refill  . albuterol (PROVENTIL HFA;VENTOLIN HFA) 108 (90 BASE) MCG/ACT inhaler Inhale 2 puffs into the lungs every 6 (six) hours as needed for wheezing.  1 Inhaler  0  . aspirin 81 MG tablet Take 81 mg by mouth daily.      Marland Kitchen ipratropium (ATROVENT) 0.03 % nasal spray Place 2 sprays into the nose 4 (four) times daily.  30 mL  6  . lisinopril (PRINIVIL,ZESTRIL) 40 MG tablet Take 1 tablet (40 mg total) by mouth daily.  90 tablet  3  .  alendronate (FOSAMAX) 35 MG tablet Take 1 tablet (35 mg total) by mouth every 7 (seven) days. Take with a full glass of water on an empty stomach.  13 tablet  3  . calcium carbonate (OS-CAL) 1250 MG chewable tablet Chew 1 tablet by mouth daily.      . ciprofloxacin (CIPRO) 250 MG tablet Take 1 tablet (250 mg total) by mouth 2 (two) times daily.  10 tablet  0  . diphenhydrAMINE (BENADRYL) 25 mg capsule Take 25 mg by mouth every 6 (six) hours as needed.      Marland Kitchen HYDROcodone-homatropine (HYCODAN) 5-1.5 MG/5ML syrup Take 5 mLs by mouth every 4 (four) hours as needed for cough.  120 mL  0  . loratadine (CLARITIN) 10 MG tablet Take 10 mg by mouth daily.      Marland Kitchen moxifloxacin (AVELOX) 400 MG tablet Take 1 tablet (400 mg total) by mouth daily.  7 tablet  0  . predniSONE (DELTASONE) 20 MG tablet 2 daily with food  10 tablet  1   No current facility-administered medications on file prior to visit.    Review of Systems:  As per HPI, otherwise negative.    Physical Examination: Filed Vitals:   11/30/13 0831  BP: 142/70  Pulse: 84  Temp: 98.6 F (  37 C)  Resp: 18   Filed Vitals:   11/30/13 0831  Height: 5' 0.5" (1.537 m)  Weight: 173 lb (78.472 kg)   Body mass index is 33.22 kg/(m^2). Ideal Body Weight: Weight in (lb) to have BMI = 25: 129.9  GEN: WDWN, NAD, Non-toxic, A & O x 3 HEENT: Atraumatic, Normocephalic. Neck supple. No masses, No LAD. Ears and Nose: No external deformity. CV: RRR, No M/G/R. No JVD. No thrill. No extra heart sounds. PULM: CTA B, no wheezes, crackles, rhonchi. No retractions. No resp. distress. No accessory muscle use. ABD: S, NT, ND, +BS. No rebound. No HSM. EXTR: No c/c/e NEURO Normal gait.  PSYCH: Normally interactive. Conversant. Not depressed or anxious appearing.  Calm demeanor.    Assessment and Plan: Hypertension Refill med Future labs GI and MM referral 104  Signed,  Phillips OdorJeffery Francess Mullen, MD

## 2013-11-30 NOTE — Patient Instructions (Signed)

## 2013-12-05 ENCOUNTER — Other Ambulatory Visit (INDEPENDENT_AMBULATORY_CARE_PROVIDER_SITE_OTHER): Payer: BC Managed Care – PPO | Admitting: Family Medicine

## 2013-12-05 DIAGNOSIS — I10 Essential (primary) hypertension: Secondary | ICD-10-CM

## 2013-12-05 DIAGNOSIS — E7800 Pure hypercholesterolemia, unspecified: Secondary | ICD-10-CM

## 2013-12-05 LAB — CBC WITH DIFFERENTIAL/PLATELET
BASOS ABS: 0 10*3/uL (ref 0.0–0.1)
Basophils Relative: 0 % (ref 0–1)
EOS ABS: 0.3 10*3/uL (ref 0.0–0.7)
EOS PCT: 4 % (ref 0–5)
HEMATOCRIT: 44.7 % (ref 36.0–46.0)
Hemoglobin: 15.2 g/dL — ABNORMAL HIGH (ref 12.0–15.0)
Lymphocytes Relative: 34 % (ref 12–46)
Lymphs Abs: 2.4 10*3/uL (ref 0.7–4.0)
MCH: 30.3 pg (ref 26.0–34.0)
MCHC: 34 g/dL (ref 30.0–36.0)
MCV: 89 fL (ref 78.0–100.0)
MONO ABS: 0.6 10*3/uL (ref 0.1–1.0)
Monocytes Relative: 8 % (ref 3–12)
Neutro Abs: 3.7 10*3/uL (ref 1.7–7.7)
Neutrophils Relative %: 54 % (ref 43–77)
PLATELETS: 325 10*3/uL (ref 150–400)
RBC: 5.02 MIL/uL (ref 3.87–5.11)
RDW: 12.7 % (ref 11.5–15.5)
WBC: 7 10*3/uL (ref 4.0–10.5)

## 2013-12-05 LAB — LIPID PANEL
CHOL/HDL RATIO: 4.2 ratio
CHOLESTEROL: 214 mg/dL — AB (ref 0–200)
HDL: 51 mg/dL (ref >39)
LDL Cholesterol: 140 mg/dL — ABNORMAL HIGH (ref 0–99)
TRIGLYCERIDES: 113 mg/dL (ref <150)
VLDL: 23 mg/dL (ref 0–40)

## 2013-12-05 LAB — COMPREHENSIVE METABOLIC PANEL
ALT: 31 U/L (ref 0–35)
AST: 50 U/L — AB (ref 0–37)
Albumin: 3.8 g/dL (ref 3.5–5.2)
Alkaline Phosphatase: 98 U/L (ref 39–117)
BILIRUBIN TOTAL: 0.4 mg/dL (ref 0.3–1.2)
BUN: 8 mg/dL (ref 6–23)
CALCIUM: 10.4 mg/dL (ref 8.4–10.5)
CO2: 31 meq/L (ref 19–32)
CREATININE: 0.83 mg/dL (ref 0.50–1.10)
Chloride: 104 mEq/L (ref 96–112)
Glucose, Bld: 98 mg/dL (ref 70–99)
Potassium: 4.8 mEq/L (ref 3.5–5.3)
Sodium: 142 mEq/L (ref 135–145)
Total Protein: 6.8 g/dL (ref 6.0–8.3)

## 2013-12-05 LAB — TSH: TSH: 1.794 u[IU]/mL (ref 0.350–4.500)

## 2013-12-05 NOTE — Progress Notes (Signed)
Lab work only

## 2014-01-24 ENCOUNTER — Encounter: Payer: Self-pay | Admitting: Emergency Medicine

## 2014-10-15 ENCOUNTER — Ambulatory Visit (INDEPENDENT_AMBULATORY_CARE_PROVIDER_SITE_OTHER): Payer: BC Managed Care – PPO | Admitting: Family Medicine

## 2014-10-15 VITALS — BP 136/78 | HR 82 | Temp 98.2°F | Resp 16 | Ht 59.75 in | Wt 170.8 lb

## 2014-10-15 DIAGNOSIS — I10 Essential (primary) hypertension: Secondary | ICD-10-CM

## 2014-10-15 DIAGNOSIS — J302 Other seasonal allergic rhinitis: Secondary | ICD-10-CM

## 2014-10-15 MED ORDER — LISINOPRIL 40 MG PO TABS: 40.0000 mg | ORAL_TABLET | Freq: Every day | ORAL | Status: DC

## 2014-10-15 MED ORDER — TRIAMCINOLONE ACETONIDE 55 MCG/ACT NA AERO: 2.0000 | INHALATION_SPRAY | Freq: Every day | NASAL | Status: DC

## 2014-10-15 NOTE — Progress Notes (Signed)
   Subjective:    Patient ID: Sally Bautista, female    DOB: October 27, 1946, 68 y.o.   MRN: [id number]  HPI Patient presents today for refill of lisinopril and to discuss sinus issues. She has had 6 weeks of nasal drainage. She wakes up at night to blow her nose. She has large amounts of nonprulent drainage. Has taken claritin and other antihistamines without much relief. Nasal drainage is clear. She feels congested. She does not have a lot of sinus pressure. Uses an OTC decongestant spray, maybe a little "too much." Has tried flonase in the past, but found the floral smell/taste irritating. Has not tried oral decongestant. Uses claritin occasionally.   Tolerating lisinopril well.   Review of Systems No fever, No headaches, has had decreased sense of smell for 20+ years, no fever, no cough, ears itch. No chest pain, no cough, no edema.     Objective:   Physical Exam  Constitutional: She is oriented to person, place, and time. She appears well-developed and well-nourished.  HENT:  Head: Normocephalic.  Right Ear: Tympanic membrane, external ear and ear canal normal.  Left Ear: Tympanic membrane, external ear and ear canal normal.  Nose: Mucosal edema and rhinorrhea present. Right sinus exhibits no maxillary sinus tenderness and no frontal sinus tenderness. Left sinus exhibits no maxillary sinus tenderness and no frontal sinus tenderness.  Mouth/Throat: Oropharynx is clear and moist. No oropharyngeal exudate.  Nasal congestion.  Cardiovascular: Normal rate, regular rhythm and normal heart sounds.   Pulmonary/Chest: Effort normal and breath sounds normal.  Musculoskeletal: Normal range of motion.  Neurological: She is alert and oriented to person, place, and time.  Skin: Skin is warm and dry.  Psychiatric: She has a normal mood and affect. Her behavior is normal. Judgment and thought content normal.  Vitals reviewed. BP 136/78 mmHg  Pulse 82  Temp(Src) 98.2 F (36.8 C) (Oral)  Resp 16  Ht  4' 11.75" (1.518 m)  Wt 170 lb 12.8 oz (77.474 kg)  BMI 33.62 kg/m2  SpO2 98%     Assessment & Plan:  1. Essential hypertension - lisinopril (PRINIVIL,ZESTRIL) 40 MG tablet; Take 1 tablet (40 mg total) by mouth daily.  Dispense: 90 tablet; Refill: 3  2. Other seasonal allergic rhinitis - triamcinolone (NASACORT AQ) 55 MCG/ACT AERO nasal inhaler; Place 2 sprays into the nose daily.  Dispense: 1 Inhaler; Refill: 12 - can try sudafed sparingly, continue saline rinses  Emi Belfasteborah B. Gessner, FNP-BC  [facility]  10/17/2014 7:54 AM

## 2014-10-15 NOTE — Patient Instructions (Signed)
Can try sudafed (plain, generic ok) once a day Afrin nose spray for up to 4 days- then take a break Make an appointment for your mammogram!!! Follow up in 6 months for complete physical exam  Allergic Rhinitis Allergic rhinitis is when the mucous membranes in the nose respond to allergens. Allergens are particles in the air that cause your body to have an allergic reaction. This causes you to release allergic antibodies. Through a chain of events, these eventually cause you to release histamine into the blood stream. Although meant to protect the body, it is this release of histamine that causes your discomfort, such as frequent sneezing, congestion, and an itchy, runny nose.  CAUSES  Seasonal allergic rhinitis (hay fever) is caused by pollen allergens that may come from grasses, trees, and weeds. Year-round allergic rhinitis (perennial allergic rhinitis) is caused by allergens such as house dust mites, pet dander, and mold spores.  SYMPTOMS   Nasal stuffiness (congestion).  Itchy, runny nose with sneezing and tearing of the eyes. DIAGNOSIS  Your health care provider can help you determine the allergen or allergens that trigger your symptoms. If you and your health care provider are unable to determine the allergen, skin or blood testing may be used. TREATMENT  Allergic rhinitis does not have a cure, but it can be controlled by:  Medicines and allergy shots (immunotherapy).  Avoiding the allergen. Hay fever may often be treated with antihistamines in pill or nasal spray forms. Antihistamines block the effects of histamine. There are over-the-counter medicines that may help with nasal congestion and swelling around the eyes. Check with your health care provider before taking or giving this medicine.  If avoiding the allergen or the medicine prescribed do not work, there are many new medicines your health care provider can prescribe. Stronger medicine may be used if initial measures are  ineffective. Desensitizing injections can be used if medicine and avoidance does not work. Desensitization is when a patient is given ongoing shots until the body becomes less sensitive to the allergen. Make sure you follow up with your health care provider if problems continue. HOME CARE INSTRUCTIONS It is not possible to completely avoid allergens, but you can reduce your symptoms by taking steps to limit your exposure to them. It helps to know exactly what you are allergic to so that you can avoid your specific triggers. SEEK MEDICAL CARE IF:   You have a fever.  You develop a cough that does not stop easily (persistent).  You have shortness of breath.  You start wheezing.  Symptoms interfere with normal daily activities. Document Released: 08/02/2001 Document Revised: 11/12/2013 Document Reviewed: 07/15/2013 [facility] Patient Information 2015 [employer] [place]. This information is not intended to replace advice given to you by your health care provider. Make sure you discuss any questions you have with your health care provider.

## 2014-12-06 IMAGING — CR DG CHEST 2V
2 series · 2 of 2 positions shown · non-contrast
Comparison: None.

CLINICAL DATA: Cough.

CHEST - 2 VIEW

[PA]
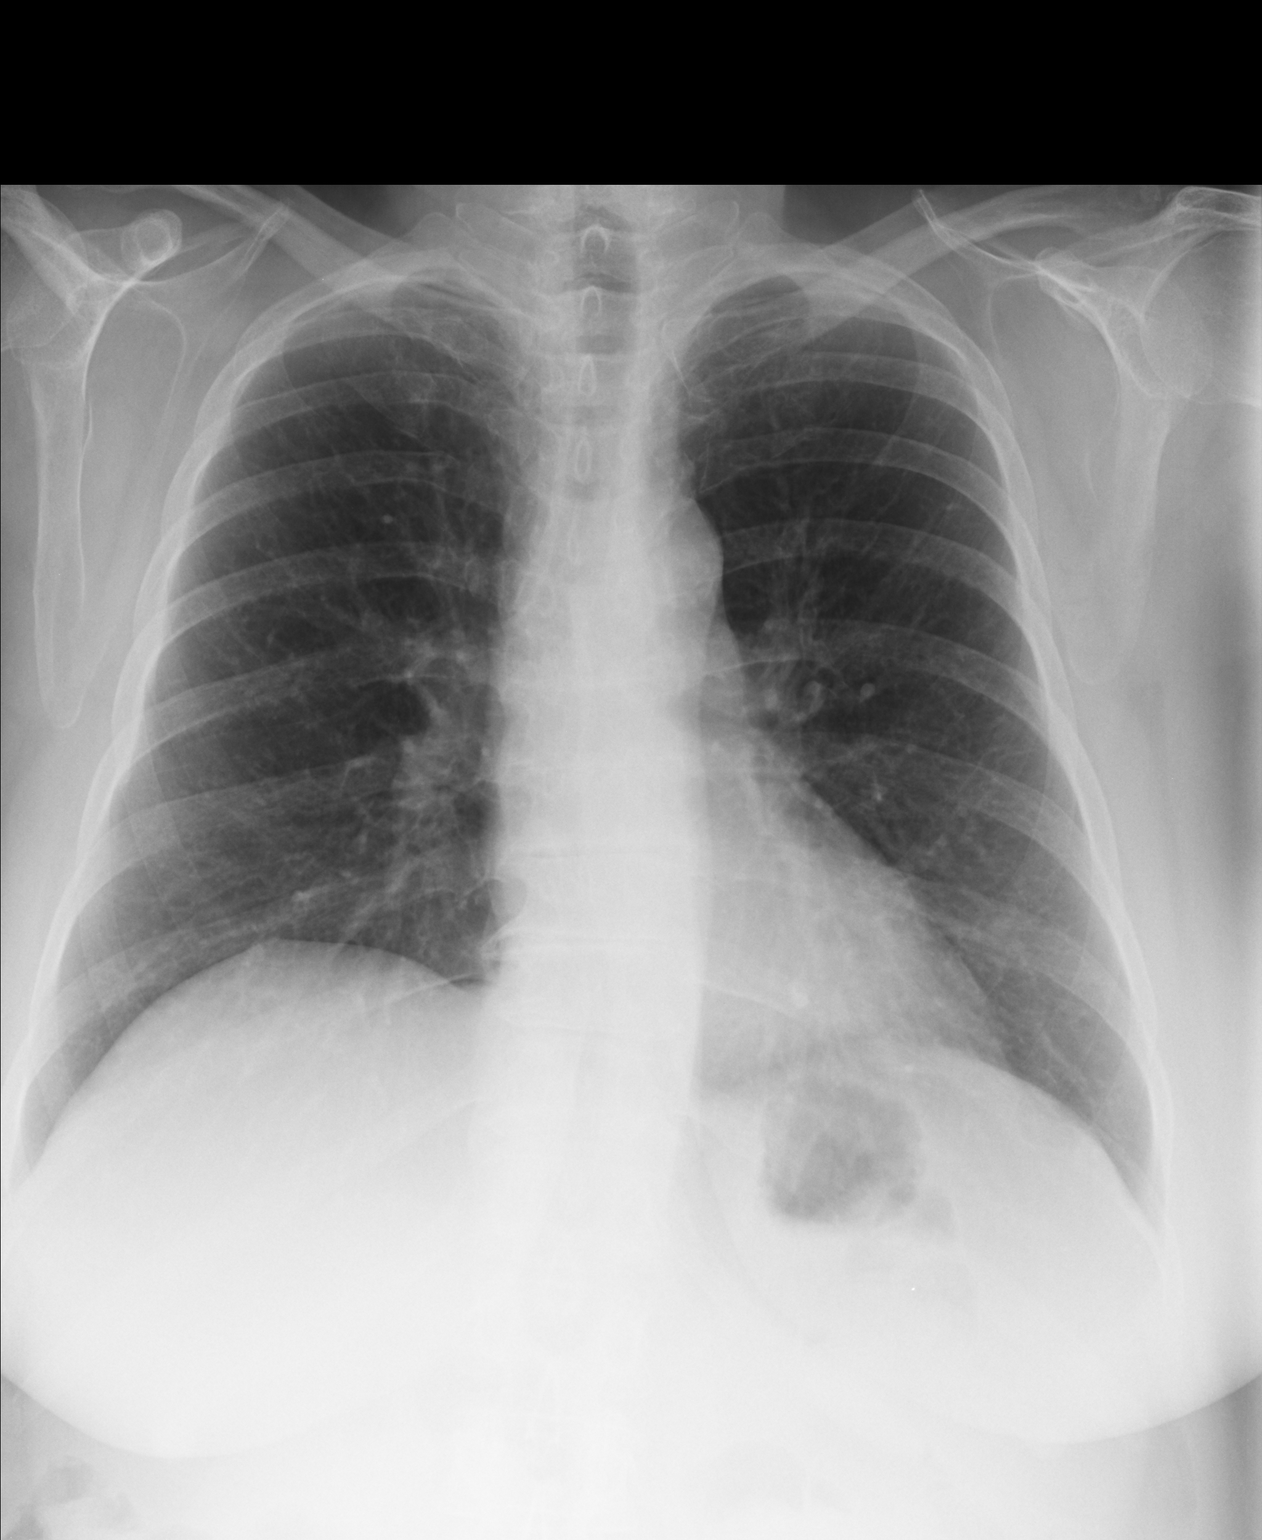

[lateral]
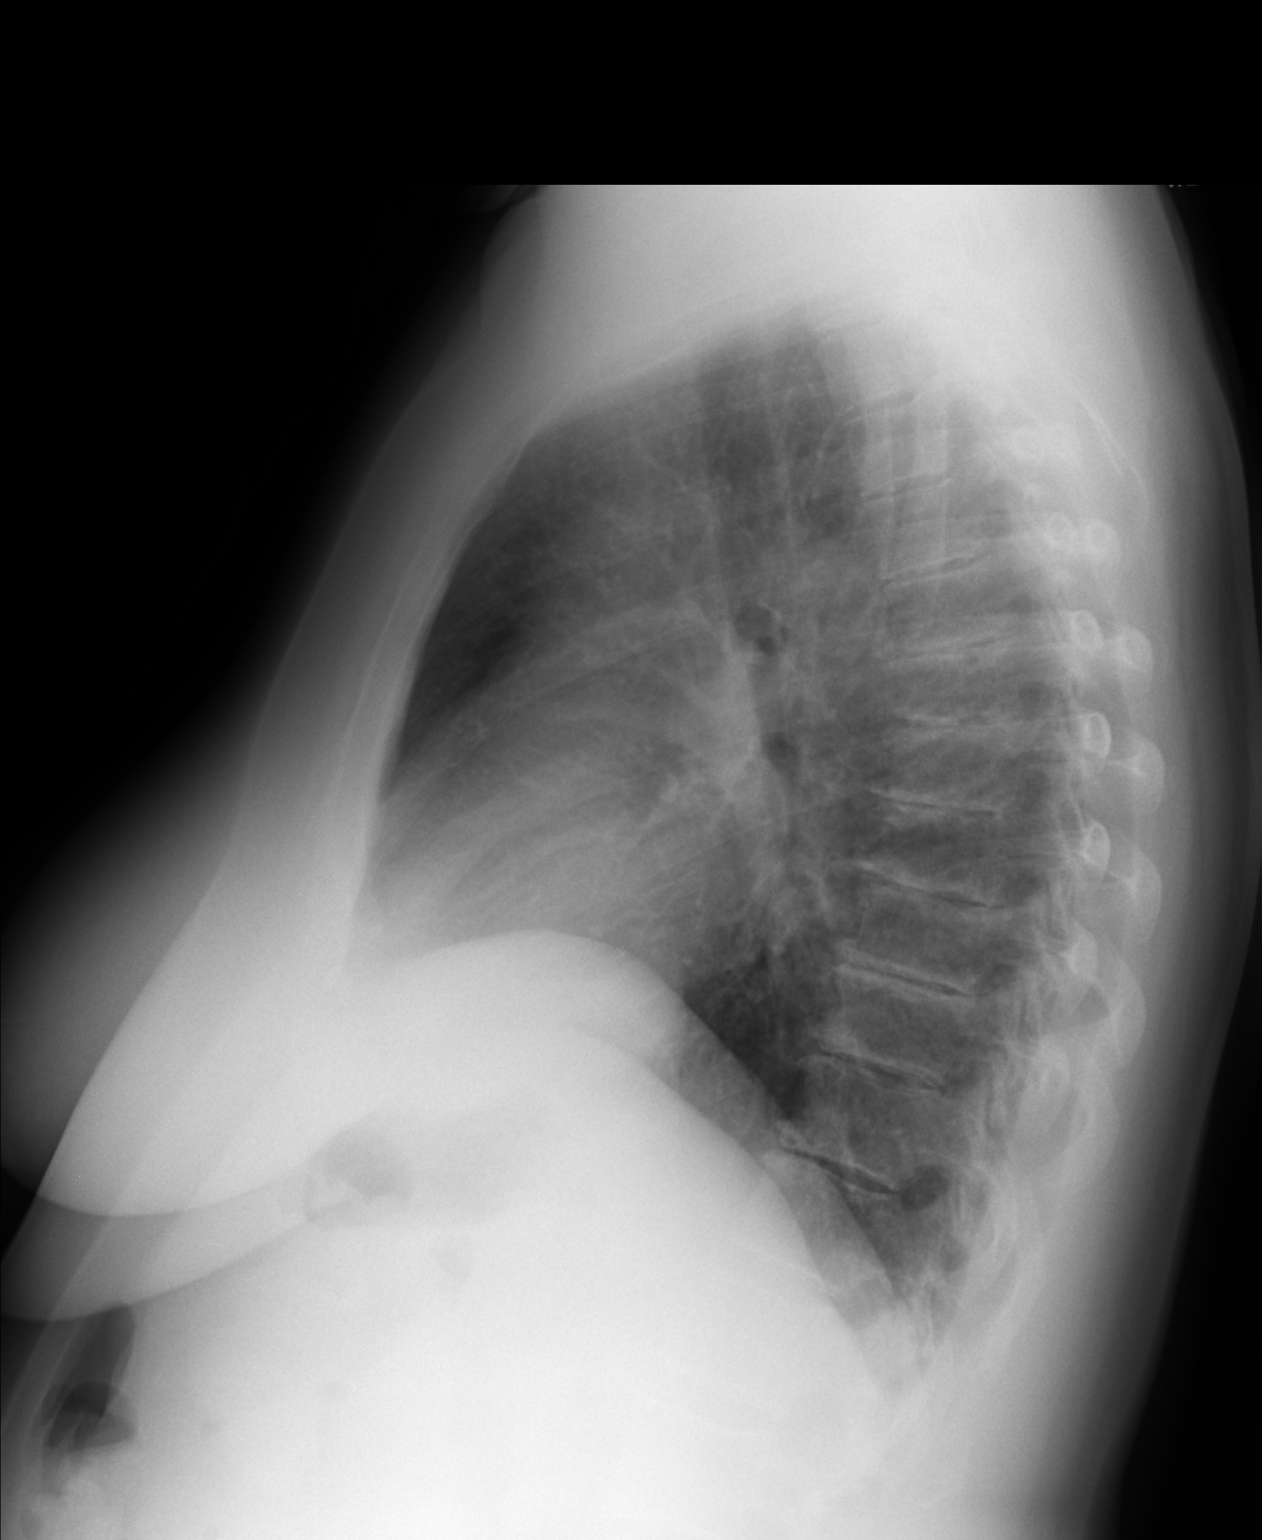

[2 of 2 positions shown; findings below may reference images not displayed]

FINDINGS: Trachea is midline.  Heart size normal.  Lungs are clear.
No pleural fluid. Mild anterior wedging of a mid thoracic vertebral
body, age indeterminate.
IMPRESSION: 1.  No acute findings.
2.  Mild anterior wedging of a mid thoracic vertebral body is age
indeterminate.

## 2015-10-13 ENCOUNTER — Ambulatory Visit (INDEPENDENT_AMBULATORY_CARE_PROVIDER_SITE_OTHER): Payer: BLUE CROSS/BLUE SHIELD | Admitting: Family Medicine

## 2015-10-13 ENCOUNTER — Encounter: Payer: Self-pay | Admitting: Family Medicine

## 2015-10-13 VITALS — BP 132/68 | HR 73 | Temp 97.8°F | Resp 16 | Wt 170.0 lb

## 2015-10-13 DIAGNOSIS — I10 Essential (primary) hypertension: Secondary | ICD-10-CM

## 2015-10-13 DIAGNOSIS — J302 Other seasonal allergic rhinitis: Secondary | ICD-10-CM

## 2015-10-13 DIAGNOSIS — E669 Obesity, unspecified: Secondary | ICD-10-CM | POA: Diagnosis not present

## 2015-10-13 DIAGNOSIS — Z23 Encounter for immunization: Secondary | ICD-10-CM | POA: Diagnosis not present

## 2015-10-13 DIAGNOSIS — E785 Hyperlipidemia, unspecified: Secondary | ICD-10-CM

## 2015-10-13 LAB — COMPREHENSIVE METABOLIC PANEL WITH GFR
ALT: 20 U/L (ref 6–29)
AST: 24 U/L (ref 10–35)
Albumin: 3.9 g/dL (ref 3.6–5.1)
Alkaline Phosphatase: 97 U/L (ref 33–130)
BUN: 9 mg/dL (ref 7–25)
CO2: 26 mmol/L (ref 20–31)
Calcium: 9.6 mg/dL (ref 8.6–10.4)
Chloride: 103 mmol/L (ref 98–110)
Creat: 0.8 mg/dL (ref 0.50–0.99)
Glucose, Bld: 102 mg/dL — ABNORMAL HIGH (ref 65–99)
Potassium: 4.3 mmol/L (ref 3.5–5.3)
Sodium: 138 mmol/L (ref 135–146)
Total Bilirubin: 0.8 mg/dL (ref 0.2–1.2)
Total Protein: 6.7 g/dL (ref 6.1–8.1)

## 2015-10-13 LAB — CBC
HCT: 42.6 % (ref 36.0–46.0)
Hemoglobin: 15.1 g/dL — ABNORMAL HIGH (ref 12.0–15.0)
MCH: 30.4 pg (ref 26.0–34.0)
MCHC: 35.4 g/dL (ref 30.0–36.0)
MCV: 85.9 fL (ref 78.0–100.0)
MPV: 10.8 fL (ref 8.6–12.4)
Platelets: 297 K/uL (ref 150–400)
RBC: 4.96 MIL/uL (ref 3.87–5.11)
RDW: 13 % (ref 11.5–15.5)
WBC: 8.1 K/uL (ref 4.0–10.5)

## 2015-10-13 LAB — LIPID PANEL
Cholesterol: 235 mg/dL — ABNORMAL HIGH (ref 125–200)
HDL: 57 mg/dL
LDL Cholesterol: 144 mg/dL — ABNORMAL HIGH
Total CHOL/HDL Ratio: 4.1 ratio
Triglycerides: 169 mg/dL — ABNORMAL HIGH
VLDL: 34 mg/dL — ABNORMAL HIGH

## 2015-10-13 LAB — TSH: TSH: 2.789 u[IU]/mL (ref 0.350–4.500)

## 2015-10-13 MED ORDER — TRIAMCINOLONE ACETONIDE 55 MCG/ACT NA AERO: 2.0000 | INHALATION_SPRAY | Freq: Every day | NASAL | Status: DC

## 2015-10-13 MED ORDER — LISINOPRIL 40 MG PO TABS: 40.0000 mg | ORAL_TABLET | Freq: Every day | ORAL | Status: DC

## 2015-10-13 NOTE — Progress Notes (Signed)
Subjective:    Patient ID: Sally Bautista, female    DOB: 10/07/1946, 69 y.o.   MRN: [id number]  HPI This is a pleasant 69 yo female who presents today for follow up of HTN. She has had a good year health wise. She has had several stressors. Her older brother passed away following a prolonged hospitalization. Her previous boss retired and she has a new, younger boss who has made multiple changes. She is trying to "be like Gumby," and be flexible.   She is aware she is overdue for her mammogram; she forgot to schedule while her brother was ill. She intends to call and schedule soon.   Allergies have been bothersome even with zyrtec. Runny nose, nasal congestion and ear pressure. She was prescribed Nasacourt last year, but for some reason she never got it. Breathing good- no wheezing, no chest tightness, no SOB. Rarely needs inhaler.   Past Medical History  Diagnosis Date  . Allergic rhinitis, cause unspecified     Allergic rhinitis  . Arthritis   . HTN (hypertension)    Past Surgical History  Procedure Laterality Date  . Cesarean section    . Dilation and curettage of uterus      times 2   Family History  Problem Relation Age of Onset  . Arthritis Mother   . Diabetes Brother   . Asthma Daughter    Social History  Substance Use Topics  . Smoking status: Never Smoker   . Smokeless tobacco: Never Used  . Alcohol Use: No   Review of Systems No chest pain, no SOB, no swelling    Objective:   Physical Exam  Constitutional: She is oriented to person, place, and time. She appears well-developed and well-nourished.  HENT:  Head: Normocephalic and atraumatic.  Right Ear: Tympanic membrane, external ear and ear canal normal.  Left Ear: Tympanic membrane, external ear and ear canal normal.  Nose: Mucosal edema and rhinorrhea present.  Mouth/Throat: Uvula is midline. Posterior oropharyngeal erythema present.  Eyes: Conjunctivae are normal.  Neck: Normal range of motion. Neck  supple.  Cardiovascular: Normal rate, regular rhythm and normal heart sounds.   Pulmonary/Chest: Effort normal and breath sounds normal.  Musculoskeletal: She exhibits no edema.  Lymphadenopathy:    She has no cervical adenopathy.  Neurological: She is alert and oriented to person, place, and time.  Skin: Skin is warm and dry.  Psychiatric: She has a normal mood and affect. Her behavior is normal. Judgment and thought content normal.  Vitals reviewed.  BP 151/85 mmHg  Pulse 73  Temp(Src) 97.8 F (36.6 C) (Oral)  Resp 16  Wt 170 lb (77.111 kg) Wt Readings from Last 3 Encounters:  10/13/15 170 lb (77.111 kg)  10/15/14 170 lb 12.8 oz (77.474 kg)  11/30/13 173 lb (78.472 kg)  Blood pressure recheck 132/68    Assessment & Plan:  1. Essential hypertension - lisinopril (PRINIVIL,ZESTRIL) 40 MG tablet; Take 1 tablet (40 mg total) by mouth daily.  Dispense: 90 tablet; Refill: 3 - CBC - Comprehensive metabolic panel  2. Other seasonal allergic rhinitis - triamcinolone (NASACORT AQ) 55 MCG/ACT AERO nasal inhaler; Place 2 sprays into the nose daily.  Dispense: 1 Inhaler; Refill: 12  3. Elevated lipids - Lipid panel - TSH  4. Obesity - encouraged her to add some more movement to her daily schedule, make healthy food choices - Lipid panel - TSH  5. Flu vaccine need - Flu Vaccine QUAD 36+ mos IM  -  follow up 6 months for CPE  Olean Reeeborah Gessner, FNP-BC  [facility]  10/17/2015 7:05 AM

## 2015-10-13 NOTE — Patient Instructions (Signed)
Please call and schedule your mammogram. 

## 2015-11-03 ENCOUNTER — Ambulatory Visit (INDEPENDENT_AMBULATORY_CARE_PROVIDER_SITE_OTHER): Payer: 59 | Admitting: Family Medicine

## 2015-11-03 ENCOUNTER — Encounter: Payer: Self-pay | Admitting: Family Medicine

## 2015-11-03 VITALS — BP 146/86 | HR 79 | Temp 98.5°F | Resp 16 | Ht 60.0 in | Wt 169.0 lb

## 2015-11-03 DIAGNOSIS — J0101 Acute recurrent maxillary sinusitis: Secondary | ICD-10-CM | POA: Diagnosis not present

## 2015-11-03 DIAGNOSIS — J209 Acute bronchitis, unspecified: Secondary | ICD-10-CM

## 2015-11-03 MED ORDER — PREDNISONE 20 MG PO TABS: ORAL_TABLET | ORAL | Status: DC

## 2015-11-03 MED ORDER — ALBUTEROL SULFATE 108 (90 BASE) MCG/ACT IN AEPB: 2.0000 | INHALATION_SPRAY | Freq: Four times a day (QID) | RESPIRATORY_TRACT | Status: DC | PRN

## 2015-11-03 MED ORDER — AMOXICILLIN-POT CLAVULANATE 875-125 MG PO TABS: 1.0000 | ORAL_TABLET | Freq: Two times a day (BID) | ORAL | Status: DC

## 2015-11-03 NOTE — Patient Instructions (Signed)

## 2015-11-03 NOTE — Progress Notes (Signed)
Patient ID: Sally Bautista MRN: [id number], DOB: [birth year], 69 y.o. Date of Encounter: 11/03/2015, 8:29 AM  Primary Physician: Dois DavenportICHTER,KAREN L., MD  Chief Complaint:  Chief Complaint  Patient presents with  . Cough    x 1.5 weeks   . Nasal Congestion  . Headache    HPI: 69 y.o. year old female presents with 14 day history of nasal congestion, post nasal drip, sore throat, sinus pressure, and cough. Afebrile. No chills. Nasal congestion thick and green/yellow. Sinus pressure is the worst symptom. Cough is productive secondary to post nasal drip and not associated with time of day. Ears feel full, leading to sensation of muffled hearing. Has tried OTC cold preps without success. No GI complaints.   No recent antibiotics, recent travels, vomiting, or sick contacts   No leg trauma, sedentary periods, h/o cancer, or tobacco use.  Past Medical History  Diagnosis Date  . Allergic rhinitis, cause unspecified     Allergic rhinitis  . Arthritis   . HTN (hypertension)      Home Meds: Prior to Admission medications   Medication Sig Start Date End Date Taking? Authorizing Provider  albuterol (PROVENTIL HFA;VENTOLIN HFA) 108 (90 BASE) MCG/ACT inhaler Inhale 2 puffs into the lungs every 6 (six) hours as needed for wheezing. 11/22/12   Peyton Najjaravid H Hopper, MD  alendronate (FOSAMAX) 35 MG tablet Take 1 tablet (35 mg total) by mouth every 7 (seven) days. Take with a full glass of water on an empty stomach. Patient not taking: Reported on 10/15/2014 02/18/13   Peyton Najjaravid H Hopper, MD  aspirin 81 MG tablet Take 81 mg by mouth daily.    Historical Provider, MD  calcium carbonate (OS-CAL) 1250 MG chewable tablet Chew 1 tablet by mouth daily.    Historical Provider, MD  Cyanocobalamin (VITAMIN B 12 PO) Take by mouth.    Historical Provider, MD  diphenhydrAMINE (BENADRYL) 25 mg capsule Take 25 mg by mouth every 6 (six) hours as needed.    Historical Provider, MD  lisinopril (PRINIVIL,ZESTRIL) 40 MG tablet  Take 1 tablet (40 mg total) by mouth daily. 10/13/15   Emi Belfasteborah B Gessner, FNP  loratadine (CLARITIN) 10 MG tablet Take 10 mg by mouth daily.    Historical Provider, MD  triamcinolone (NASACORT AQ) 55 MCG/ACT AERO nasal inhaler Place 2 sprays into the nose daily. 10/13/15   Emi Belfasteborah B Gessner, FNP    Allergies:  Allergies  Allergen Reactions  . Codeine Nausea And Vomiting  . Sulfonamide Derivatives     Social History   Social History  . Marital Status: Married    Spouse Name: N/A  . Number of Children: N/A  . Years of Education: N/A   Occupational History  . Not on file.   Social History Main Topics  . Smoking status: Never Smoker   . Smokeless tobacco: Never Used  . Alcohol Use: No  . Drug Use: No  . Sexual Activity: Not on file   Other Topics Concern  . Not on file   Social History Narrative     Review of Systems: Constitutional: negative for chills, fever, night sweats or weight changes Cardiovascular: negative for chest pain or palpitations Respiratory: negative for hemoptysis, wheezing, or shortness of breath Abdominal: negative for abdominal pain, nausea, vomiting or diarrhea Dermatological: negative for rash Neurologic: negative for headache   Physical Exam: Blood pressure 146/86, pulse 79, temperature 98.5 F (36.9 C), temperature source Oral, resp. rate 16, height 5' (1.524 m), weight 169 lb (76.658  kg), SpO2 97 %., Body mass index is 33.01 kg/(m^2). General: Well developed, well nourished, in no acute distress. Head: Normocephalic, atraumatic, eyes without discharge, sclera non-icteric, nares are congested. Bilateral auditory canals clear, TM's are without perforation, pearly grey with reflective cone of light bilaterally. Serous effusion bilaterally behind TM's. Maxillary sinus TTP. Oral cavity moist, dentition normal. Posterior pharynx with post nasal drip and mild erythema. No peritonsillar abscess or tonsillar exudate. Neck: Supple. No thyromegaly. Full  ROM. No lymphadenopathy. Lungs: Clear bilaterally to auscultation without wheezes, rales, or rhonchi. Breathing is unlabored.  Heart: RRR with S1 S2. No murmurs, rubs, or gallops appreciated. Msk:  Strength and tone normal for age. Extremities: No clubbing or cyanosis. No edema. Neuro: Alert and oriented X 3. Moves all extremities spontaneously. CNII-XII grossly in tact. Psych:  Responds to questions appropriately with a normal affect.     ASSESSMENT AND PLAN:  69 y.o. year old female with sinusitis -   ICD-9-CM ICD-10-CM   1. Acute recurrent maxillary sinusitis 461.0 J01.01 amoxicillin-clavulanate (AUGMENTIN) 875-125 MG tablet  2. Acute bronchitis, unspecified organism 466.0 J20.9 predniSONE (DELTASONE) 20 MG tablet     Albuterol Sulfate (PROAIR RESPICLICK) 108 (90 BASE) MCG/ACT AEPB    -Tylenol/Motrin prn -Rest/fluids -RTC precautions -RTC 3-5 days if no improvement  Signed, Elvina Sidle, MD 11/03/2015 8:29 AM

## 2015-12-17 ENCOUNTER — Ambulatory Visit (INDEPENDENT_AMBULATORY_CARE_PROVIDER_SITE_OTHER): Payer: 59 | Admitting: Emergency Medicine

## 2015-12-17 VITALS — BP 130/80 | HR 88 | Temp 97.8°F | Resp 16 | Ht 59.5 in | Wt 167.4 lb

## 2015-12-17 DIAGNOSIS — J0101 Acute recurrent maxillary sinusitis: Secondary | ICD-10-CM | POA: Diagnosis not present

## 2015-12-17 DIAGNOSIS — J3089 Other allergic rhinitis: Secondary | ICD-10-CM

## 2015-12-17 MED ORDER — AMOXICILLIN-POT CLAVULANATE 875-125 MG PO TABS: 1.0000 | ORAL_TABLET | Freq: Two times a day (BID) | ORAL | Status: DC

## 2015-12-17 MED ORDER — FLUTICASONE PROPIONATE 50 MCG/ACT NA SUSP: 2.0000 | Freq: Every day | NASAL | Status: DC

## 2015-12-17 NOTE — Progress Notes (Signed)
Patient ID: MEGANNE RITA, female   DOB: 07/14/1946, 70 y.o.   MRN: [id number]     By signing my name below, I, Littie Deeds, attest that this documentation has been prepared under the direction and in the presence of Lesle Chris, MD.  Electronically Signed: Littie Deeds, Medical Scribe. 12/17/2015. 9:11 AM.   Chief Complaint:  Chief Complaint  Patient presents with  . frontal sinus pressure    mucus, clear/yellow/green x 3 wks, "cough from drainage"    HPI: ALLAINA BROTZMAN is a 70 y.o. female who reports to [facility] today complaining of gradual onset, frontal sinus pressure about 3 weeks ago. Patient reports having associated rhinorrhea (sometimes noticing greenish, grey "globs"). She notes she was treated for sinusitis with Augmentin about 6 weeks ago, with resolution of her cough within 2 weeks. 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 She plans to switch to part-time work and notes that her job will be changing locations, which she believes will help.  Past Medical History  Diagnosis Date  . Allergic rhinitis, cause unspecified     Allergic rhinitis  . Arthritis   . HTN (hypertension)    Past Surgical History  Procedure Laterality Date  . Cesarean section    . Dilation and curettage of uterus      times 2   Social History   Social History  . Marital Status: Married    Spouse Name: N/A  . Number of Children: N/A  . Years of Education: N/A   Social History Main Topics  . Smoking status: Never Smoker   . Smokeless tobacco: Never Used  . Alcohol Use: No  . Drug Use: No  . Sexual Activity: Not Asked   Other Topics Concern  . None   Social History Narrative   Family History  Problem Relation Age of Onset  . Arthritis Mother   . Diabetes Brother   . Asthma Daughter    Allergies  Allergen Reactions  . Codeine Nausea And Vomiting  . Sulfonamide Derivatives    Prior to Admission medications    Medication Sig Start Date End Date Taking? Authorizing Provider  albuterol (PROVENTIL HFA;VENTOLIN HFA) 108 (90 BASE) MCG/ACT inhaler Inhale 2 puffs into the lungs every 6 (six) hours as needed for wheezing. 11/22/12  Yes Peyton Najjar, MD  Albuterol Sulfate (PROAIR RESPICLICK) 108 (90 BASE) MCG/ACT AEPB Inhale 2 puffs into the lungs every 6 (six) hours as needed. 11/03/15  Yes Elvina Sidle, MD  aspirin 81 MG tablet Take 81 mg by mouth daily.   Yes Historical Provider, MD  Cyanocobalamin (VITAMIN B 12 PO) Take by mouth.   Yes Historical Provider, MD  diphenhydrAMINE (BENADRYL) 25 mg capsule Take 25 mg by mouth every 6 (six) hours as needed.   Yes Historical Provider, MD  lisinopril (PRINIVIL,ZESTRIL) 40 MG tablet Take 1 tablet (40 mg total) by mouth daily. 10/13/15  Yes Emi Belfast, FNP  alendronate (FOSAMAX) 35 MG tablet Take 1 tablet (35 mg total) by mouth every 7 (seven) days. Take with a full glass of water on an empty stomach. Patient not taking: Reported on 10/15/2014 02/18/13   Peyton Najjar, MD  calcium carbonate (OS-CAL) 1250 MG chewable tablet Chew 1 tablet by mouth daily. Reported on 12/17/2015    Historical Provider, MD  loratadine (CLARITIN) 10 MG tablet Take 10 mg by mouth daily. Reported on 12/17/2015    Historical Provider, MD  predniSONE (  DELTASONE) 20 MG tablet Two daily with food Patient not taking: Reported on 12/17/2015 11/03/15   Elvina Sidle, MD  triamcinolone (NASACORT AQ) 55 MCG/ACT AERO nasal inhaler Place 2 sprays into the nose daily. Patient not taking: Reported on 12/17/2015 10/13/15   Emi Belfast, FNP     ROS: The patient denies fevers, chills, night sweats, unintentional weight loss, chest pain, palpitations, wheezing, dyspnea on exertion, nausea, vomiting, abdominal pain, dysuria, hematuria, melena, numbness, weakness, or tingling.  All other systems have been reviewed and were otherwise negative with the exception of those mentioned in the HPI and  as above.    PHYSICAL EXAM: Filed Vitals:   12/17/15 0846  BP: 130/80  Pulse: 88  Temp: 97.8 F (36.6 C)  Resp: 16   Body mass index is 33.26 kg/(m^2).   General: Alert, no acute distress HEENT:  Normocephalic, atraumatic, oropharynx patent. Ears normal. Significant nasal congest with purulent drainage on the right. There is redness and irritation at the base of the nose. Throat normal. Eye: [facility] Cardiovascular:  Regular rate and rhythm, no rubs murmurs or gallops.  No Carotid bruits, radial pulse intact. No pedal edema.  Respiratory: Clear to auscultation bilaterally.  No wheezes, rales, or rhonchi.  No cyanosis, no use of accessory musculature Abdominal: No organomegaly, abdomen is soft and non-tender, positive bowel sounds.  No masses. Musculoskeletal: Gait intact. No edema, tenderness Skin: No rashes. Neurologic: Facial musculature symmetric. Psychiatric: Patient acts appropriately throughout our interaction. Lymphatic: No cervical or submandibular lymphadenopathy     LABS:    EKG/XRAY:   Primary read interpreted by Dr. Cleta Alberts at [facility].   ASSESSMENT/PLAN: Patient has acute maxillary sinusitis. She also has severe allergic rhinitis. Will treat with Augmentin and Flonase. She will let me know if she desires allergy referral.I personally performed the services described in this documentation, which was scribed in my presence. The recorded information has been reviewed and is accurate.    Gross sideeffects, risk and benefits, and alternatives of medications d/w patient. Patient is aware that all medications have potential sideeffects and we are unable to predict every sideeffect or drug-drug interaction that may occur.  Lesle Chris MD 12/17/2015 9:11 AM

## 2015-12-17 NOTE — Patient Instructions (Signed)

## 2016-06-24 ENCOUNTER — Ambulatory Visit (INDEPENDENT_AMBULATORY_CARE_PROVIDER_SITE_OTHER): Payer: 59 | Admitting: Physician Assistant

## 2016-06-24 VITALS — BP 124/72 | HR 73 | Temp 98.2°F | Resp 18 | Ht 59.5 in | Wt 162.0 lb

## 2016-06-24 DIAGNOSIS — F439 Reaction to severe stress, unspecified: Secondary | ICD-10-CM

## 2016-06-24 DIAGNOSIS — Z658 Other specified problems related to psychosocial circumstances: Secondary | ICD-10-CM

## 2016-06-24 DIAGNOSIS — J0110 Acute frontal sinusitis, unspecified: Secondary | ICD-10-CM

## 2016-06-24 DIAGNOSIS — R05 Cough: Secondary | ICD-10-CM

## 2016-06-24 DIAGNOSIS — R059 Cough, unspecified: Secondary | ICD-10-CM

## 2016-06-24 MED ORDER — AMOXICILLIN-POT CLAVULANATE 875-125 MG PO TABS: 1.0000 | ORAL_TABLET | Freq: Two times a day (BID) | ORAL | 0 refills | Status: AC

## 2016-06-24 MED ORDER — BENZONATATE 100 MG PO CAPS: 100.0000 mg | ORAL_CAPSULE | Freq: Three times a day (TID) | ORAL | 0 refills | Status: DC | PRN

## 2016-06-24 MED ORDER — ALPRAZOLAM 0.25 MG PO TABS
0.2500 mg | ORAL_TABLET | Freq: Every evening | ORAL | 0 refills | Status: DC | PRN
Start: 2016-06-24 — End: 2017-10-20

## 2016-06-24 NOTE — Patient Instructions (Addendum)
Begin using flonase and claritin daily. Take antibiotics as prescribed. Eat yogurt while on antibiotics or take a probiotic to avoid stomach issues. Use tessalon perles for cough as needed.  Return to clinic if symptoms worsen, do not improve, or as needed    Sinusitis, Adult Sinusitis is redness, soreness, and puffiness (inflammation) of the air pockets in the bones of your face (sinuses). The redness, soreness, and puffiness can cause air and mucus to get trapped in your sinuses. This can allow germs to grow and cause an infection.  HOME CARE   Drink enough fluids to keep your pee (urine) clear or pale yellow.  Use a humidifier in your home.  Run a hot shower to create steam in the bathroom. Sit in the bathroom with the door closed. Breathe in the steam 3-4 times a day.  Put a warm, moist washcloth on your face 3-4 times a day, or as told by your doctor.  Use salt water sprays (saline sprays) to wet the thick fluid in your nose. This can help the sinuses drain.  Only take medicine as told by your doctor. GET HELP RIGHT AWAY IF:   Your pain gets worse.  You have very bad headaches.  You are sick to your stomach (nauseous).  You throw up (vomit).  You are very sleepy (drowsy) all the time.  Your face is puffy (swollen).  Your vision changes.  You have a stiff neck.  You have trouble breathing. MAKE SURE YOU:   Understand these instructions.  Will watch your condition.  Will get help right away if you are not doing well or get worse.   This information is not intended to replace advice given to you by your health care provider. Make sure you discuss any questions you have with your health care provider.   Document Released: 04/25/2008 Document Revised: 11/28/2014 Document Reviewed: 06/12/2012 Elsevier Interactive Patient Education 2016 [employer].     IF you received an x-ray today, you will receive an invoice from [facility]. Please contact  [facility] at [phone] with questions or concerns regarding your invoice.   IF you received labwork today, you will receive an invoice from [employer]. Please contact [facility] at [phone] with questions or concerns regarding your invoice.   Our billing staff will not be able to assist you with questions regarding bills from these companies.  You will be contacted with the lab results as soon as they are available. The fastest way to get your results is to activate your My Chart account. Instructions are located on the last page of this paperwork. If you have not heard from [place] regarding the results in 2 weeks, please contact this office.

## 2016-06-24 NOTE — Progress Notes (Signed)
Sally Bautista  MRN: [id number] DOB: 01-30-1946  Subjective:  Sally Bautista is a 70 y.o. female seen in office today for a chief complaint of progressive sinus pressure x 2 weeks. Has associated post nasal drip, congestion, lightheadedness, dry cough, subjective fever, and uncontrolled seasonal allergies. Denies headache, chills, fatigue, and sneezing. Has tried pseudoephrine, afrin, and bendryl with minimal relief.   Pt also questions if she can have some medication for infrequent situational stress. Notes that five years ago, she received a prescription for 30 pills of xanax for similar complaint. Pt only used 5 pills before the expiration date on the bottle so she tossed the rest in the trash.  Pt is trying to retire but still working 3 days a week. Her stress is most often present when her company cuts her hours back at work because she really enjoys working. Has worked since age 55 and being at home is something she is not yet accustomed to. No other complaints or concerns today.   Review of Systems  HENT: Negative for ear pain.   Eyes: Positive for itching.  Respiratory: Negative for shortness of breath and wheezing.   Gastrointestinal: Negative for nausea and vomiting.  Psychiatric/Behavioral: Negative for dysphoric mood and sleep disturbance. The patient is not nervous/anxious.     Patient Active Problem List   Diagnosis Date Noted  . HYPERGLYCEMIA, FASTING 01/29/2008  . HYPERLIPIDEMIA 11/16/2007  . HYPERTENSION 11/16/2007  . OSTEOARTHRITIS 11/16/2007    Current Outpatient Prescriptions on File Prior to Visit  Medication Sig Dispense Refill  . albuterol (PROVENTIL HFA;VENTOLIN HFA) 108 (90 BASE) MCG/ACT inhaler Inhale 2 puffs into the lungs every 6 (six) hours as needed for wheezing. 1 Inhaler 0  . Albuterol Sulfate (PROAIR RESPICLICK) 108 (90 BASE) MCG/ACT AEPB Inhale 2 puffs into the lungs every 6 (six) hours as needed. 1 each 3  . aspirin 81 MG tablet Take 81 mg by mouth  daily.    . Cyanocobalamin (VITAMIN B 12 PO) Take by mouth.    . diphenhydrAMINE (BENADRYL) 25 mg capsule Take 25 mg by mouth every 6 (six) hours as needed.    Marland Kitchen lisinopril (PRINIVIL,ZESTRIL) 40 MG tablet Take 1 tablet (40 mg total) by mouth daily. 90 tablet 3  . alendronate (FOSAMAX) 35 MG tablet Take 1 tablet (35 mg total) by mouth every 7 (seven) days. Take with a full glass of water on an empty stomach. (Patient not taking: Reported on 10/15/2014) 13 tablet 3  . calcium carbonate (OS-CAL) 1250 MG chewable tablet Chew 1 tablet by mouth daily. Reported on 12/17/2015    . fluticasone (FLONASE) 50 MCG/ACT nasal spray Place 2 sprays into both nostrils daily. (Patient not taking: Reported on 06/24/2016) 16 g 6  . loratadine (CLARITIN) 10 MG tablet Take 10 mg by mouth daily. Reported on 12/17/2015    . predniSONE (DELTASONE) 20 MG tablet Two daily with food (Patient not taking: Reported on 12/17/2015) 10 tablet 0  . triamcinolone (NASACORT AQ) 55 MCG/ACT AERO nasal inhaler Place 2 sprays into the nose daily. (Patient not taking: Reported on 12/17/2015) 1 Inhaler 12   No current facility-administered medications on file prior to visit.     Allergies  Allergen Reactions  . Codeine Nausea And Vomiting  . Sulfonamide Derivatives    Social History   Social History  . Marital status: Married    Spouse name: N/A  . Number of children: N/A  . Years of education: N/A   Occupational History  .  Not on file.   Social History Main Topics  . Smoking status: Never Smoker  . Smokeless tobacco: Never Used  . Alcohol use No  . Drug use: No  . Sexual activity: Not on file   Other Topics Concern  . Not on file   Social History Narrative  . No narrative on file    Objective:  BP 124/72   Pulse 73   Temp 98.2 F (36.8 C) (Oral)   Resp 18   Ht 4' 11.5" (1.511 m)   Wt 162 lb (73.5 kg)   SpO2 97%   BMI 32.17 kg/m   Physical Exam  Constitutional: She is oriented to person, place, and time and  well-developed, well-nourished, and in no distress.  HENT:  Head: Normocephalic and atraumatic.  Right Ear: Tympanic membrane, external ear and ear canal normal.  Left Ear: Tympanic membrane, external ear and ear canal normal.  Nose: Mucosal edema (more prounounced in left nostril) and rhinorrhea present. Right sinus exhibits no maxillary sinus tenderness and no frontal sinus tenderness. Left sinus exhibits frontal sinus tenderness. Left sinus exhibits no maxillary sinus tenderness.  Mouth/Throat: Uvula is midline and mucous membranes are normal. Posterior oropharyngeal erythema present.  Eyes: Conjunctivae are normal.  Neck: Normal range of motion.  Cardiovascular: Normal rate and regular rhythm.   Pulmonary/Chest: Effort normal.  Neurological: She is alert and oriented to person, place, and time. Gait normal.  Skin: Skin is warm and dry.  Psychiatric: Affect normal.  Vitals reviewed.   Assessment and Plan :  1. Cough - benzonatate (TESSALON) 100 MG capsule; Take 1-2 capsules (100-200 mg total) by mouth 3 (three) times daily as needed for cough.  Dispense: 40 capsule; Refill: 0  2. Acute frontal sinusitis, recurrence not specified -Due to length of presenting illness, will treat for bacterial sinusitis  - amoxicillin-clavulanate (AUGMENTIN) 875-125 MG tablet; Take 1 tablet by mouth 2 (two) times daily.  Dispense: 14 tablet; Refill: 0  3. Situational stress -Informed that if this stress begins to be a more frequent issue, will consider a different medication regimen with long term medication - ALPRAZolam (XANAX) 0.25 MG tablet; Take 1 tablet (0.25 mg total) by mouth at bedtime as needed for anxiety.  Dispense: 20 tablet; Refill: 0  -Return to clinic if symptoms worsen, do not improve, or as needed  Benjiman Core PA-C  [facility] 06/24/2016 11:05 AM

## 2016-07-23 DIAGNOSIS — J019 Acute sinusitis, unspecified: Secondary | ICD-10-CM | POA: Diagnosis not present

## 2016-07-23 DIAGNOSIS — H6502 Acute serous otitis media, left ear: Secondary | ICD-10-CM | POA: Diagnosis not present

## 2016-08-18 DIAGNOSIS — J0100 Acute maxillary sinusitis, unspecified: Secondary | ICD-10-CM | POA: Diagnosis not present

## 2016-08-18 DIAGNOSIS — H6091 Unspecified otitis externa, right ear: Secondary | ICD-10-CM | POA: Diagnosis not present

## 2016-09-09 DIAGNOSIS — Z23 Encounter for immunization: Secondary | ICD-10-CM | POA: Diagnosis not present

## 2016-09-17 ENCOUNTER — Ambulatory Visit (INDEPENDENT_AMBULATORY_CARE_PROVIDER_SITE_OTHER): Payer: 59 | Admitting: Family Medicine

## 2016-09-17 VITALS — BP 132/72 | HR 76 | Temp 98.4°F | Resp 16 | Ht 60.25 in | Wt 156.6 lb

## 2016-09-17 DIAGNOSIS — H6506 Acute serous otitis media, recurrent, bilateral: Secondary | ICD-10-CM | POA: Diagnosis not present

## 2016-09-17 DIAGNOSIS — J0191 Acute recurrent sinusitis, unspecified: Secondary | ICD-10-CM

## 2016-09-17 MED ORDER — PREDNISONE 10 MG PO TABS: ORAL_TABLET | ORAL | 0 refills | Status: DC

## 2016-09-17 MED ORDER — CLARITHROMYCIN 250 MG PO TABS: 250.0000 mg | ORAL_TABLET | Freq: Two times a day (BID) | ORAL | 0 refills | Status: DC

## 2016-09-17 NOTE — Patient Instructions (Addendum)
IF you received an x-ray today, you will receive an invoice from [facility]. Please contact [facility] at [phone] with questions or concerns regarding your invoice.   IF you received labwork today, you will receive an invoice from [facility]. Please contact [facility] at [phone] with questions or concerns regarding your invoice.   Our billing staff will not be able to assist you with questions regarding bills from these companies.  You will be contacted with the lab results as soon as they are available. The fastest way to get your results is to activate your My Chart account. Instructions are located on the last page of this paperwork. If you have not heard from us regarding the results in 2 weeks, please contact this office.   Serous Otitis Media Serous otitis media is fluid in the middle ear space. This space contains the bones for hearing and air. Air in the middle ear space helps to transmit sound.  The air gets there through the eustachian tube. This tube goes from the back of the nose (nasopharynx) to the middle ear space. It keeps the pressure in the middle ear the same as the outside world. It also helps to drain fluid from the middle ear space. CAUSES  Serous otitis media occurs when the eustachian tube gets blocked. Blockage can come from:  Ear infections.  Colds and other upper respiratory infections.  Allergies.  Irritants such as cigarette smoke.  Sudden changes in air pressure (such as descending in an airplane).  Enlarged adenoids.  A mass in the nasopharynx. During colds and upper respiratory infections, the middle ear space can become temporarily filled with fluid. This can happen after an ear infection also. Once the infection clears, the fluid will generally drain out of the ear through the eustachian tube. If it does not, then serous otitis media occurs. SIGNS AND SYMPTOMS   Hearing loss.  A feeling of  fullness in the ear, without pain.  Young children may not show any symptoms but may show slight behavioral changes, such as agitation, ear pulling, or crying. DIAGNOSIS  Serous otitis media is diagnosed by an ear exam. Tests may be done to check on the movement of the eardrum. Hearing exams may also be done. TREATMENT  The fluid most often goes away without treatment. If allergy is the cause, allergy treatment may be helpful. Fluid that persists for several months may require minor surgery. A small tube is placed in the eardrum to:  Drain the fluid.  Restore the air in the middle ear space. In certain situations, antibiotic medicines are used to avoid surgery. Surgery may be done to remove enlarged adenoids (if this is the cause). HOME CARE INSTRUCTIONS   Keep children away from tobacco smoke.  Keep all follow-up visits as directed by your health care provider. SEEK MEDICAL CARE IF:   Your hearing is not better in 3 months.  Your hearing is worse.  You have ear pain.  You have drainage from the ear.  You have dizziness.  You have serous otitis media only in one ear or have any bleeding from your nose (epistaxis).  You notice a lump on your neck. MAKE SURE YOU:  Understand these instructions.   Will watch your condition.   Will get help right away if you are not doing well or get worse.    This information is not intended to replace advice given to you by your health care provider. Make sure you discuss any questions   you have with your health care provider.   Document Released: 01/28/2004 Document Revised: 11/28/2014 Document Reviewed: 06/04/2013 Elsevier Interactive Patient Education 2016 [employer] Inc[employer]  

## 2016-09-17 NOTE — Progress Notes (Signed)
Subjective:  By signing my name below, I, Raven Small, attest that this documentation has been prepared under the direction and in the presence of Norberto SorensonEva Shaw, MD.  Electronically Signed: Andrew Auaven Small, ED Scribe. 09/17/2016. 2:45 PM.   Patient ID: Sally Bautista, female    DOB: [birth year], 70 y.o.   MRN: [id number]  HPI Chief Complaint  Patient presents with  . right ear    x 1 month,  treated with antibiotic elsewhere    HPI Comments: Sally Bautista is a 70 y.o. female who presents to the [facility] complaining of right otalgia onset 1 month. Pt was initially seen elsewhere for ear pain and was treated with a 10 day course of amoxicillin 500mg  bid. She had some improvement but ear pain returned. She's also developed associated cough, right frontal and right maxillary sinus pressure and thick postnasal drip. She has tried sudafed and OTC cough and cold medication. Pt is not a smoker. No hx of asthma. She reports family hx of asthma in her mother.  Patient Active Problem List   Diagnosis Date Noted  . HYPERGLYCEMIA, FASTING 01/29/2008  . HYPERLIPIDEMIA 11/16/2007  . HYPERTENSION 11/16/2007  . OSTEOARTHRITIS 11/16/2007   Past Medical History:  Diagnosis Date  . Allergic rhinitis, cause unspecified    Allergic rhinitis  . Arthritis   . HTN (hypertension)    Past Surgical History:  Procedure Laterality Date  . CESAREAN SECTION    . DILATION AND CURETTAGE OF UTERUS     times 2   Allergies  Allergen Reactions  . Codeine Nausea And Vomiting  . Sulfonamide Derivatives    Prior to Admission medications   Medication Sig Start Date End Date Taking? Authorizing Provider  albuterol (PROVENTIL HFA;VENTOLIN HFA) 108 (90 BASE) MCG/ACT inhaler Inhale 2 puffs into the lungs every 6 (six) hours as needed for wheezing. 11/22/12  Yes Peyton Najjaravid H Hopper, MD  ALPRAZolam Prudy Feeler(XANAX) 0.25 MG tablet Take 1 tablet (0.25 mg total) by mouth at bedtime as needed for anxiety. 06/24/16  Yes  Magdalene RiverBrittany D Wiseman, PA-C  aspirin 81 MG tablet Take 81 mg by mouth daily.   Yes Historical Provider, MD  Cyanocobalamin (VITAMIN B 12 PO) Take by mouth.   Yes Historical Provider, MD  diphenhydrAMINE (BENADRYL) 25 mg capsule Take 25 mg by mouth every 6 (six) hours as needed.   Yes Historical Provider, MD  lisinopril (PRINIVIL,ZESTRIL) 40 MG tablet Take 1 tablet (40 mg total) by mouth daily. 10/13/15  Yes Emi Belfasteborah B Gessner, FNP  Albuterol Sulfate (PROAIR RESPICLICK) 108 (90 BASE) MCG/ACT AEPB Inhale 2 puffs into the lungs every 6 (six) hours as needed. Patient not taking: Reported on 09/17/2016 11/03/15   Elvina SidleKurt Lauenstein, MD  alendronate (FOSAMAX) 35 MG tablet Take 1 tablet (35 mg total) by mouth every 7 (seven) days. Take with a full glass of water on an empty stomach. Patient not taking: Reported on 09/17/2016 02/18/13   Peyton Najjaravid H Hopper, MD  calcium carbonate (OS-CAL) 1250 MG chewable tablet Chew 1 tablet by mouth daily. Reported on 12/17/2015    Historical Provider, MD  fluticasone (FLONASE) 50 MCG/ACT nasal spray Place 2 sprays into both nostrils daily. Patient not taking: Reported on 09/17/2016 12/17/15   Collene GobbleSteven A Daub, MD  loratadine (CLARITIN) 10 MG tablet Take 10 mg by mouth daily. Reported on 12/17/2015    Historical Provider, MD  triamcinolone (NASACORT AQ) 55 MCG/ACT AERO nasal inhaler Place 2 sprays into the nose daily. Patient not  taking: Reported on 09/17/2016 10/13/15   Emi Belfasteborah B Gessner, FNP   Social History   Social History  . Marital status: Married    Spouse name: N/A  . Number of children: N/A  . Years of education: N/A   Occupational History  . Not on file.   Social History Main Topics  . Smoking status: Never Smoker  . Smokeless tobacco: Never Used  . Alcohol use No  . Drug use: No  . Sexual activity: Not on file   Other Topics Concern  . Not on file   Social History Narrative  . No narrative on file   Review of Systems  Constitutional: Negative for chills and  fever.  HENT: Positive for congestion and postnasal drip. Negative for sore throat.   Eyes: Negative for itching.  Respiratory: Positive for cough. Negative for shortness of breath.   Cardiovascular: Negative for chest pain.       Objective:   Physical Exam  Constitutional: She is oriented to person, place, and time. She appears well-developed and well-nourished. No distress.  HENT:  Right Ear: Tympanic membrane is not injected, not erythematous and not retracted. A middle ear effusion ( moderate) is present.  Left Ear: Tympanic membrane is not injected, not erythematous and not retracted. A middle ear effusion ( moderate) is present.  Cardiovascular: Normal rate and regular rhythm.   Murmur heard.  Systolic (left lower sternal border) murmur is present with a grade of 2/6  Pulmonary/Chest: Effort normal. She has no wheezes. She has no rales.  Neurological: She is alert and oriented to person, place, and time.  Skin: Skin is warm and dry.  Psychiatric: She has a normal mood and affect. Her behavior is normal.   Vitals:   09/17/16 1437 09/17/16 1447  BP: (!) 144/80 132/72  Pulse: 76   Resp: 16   Temp: 98.4 F (36.9 C)   TempSrc: Oral   SpO2: 98%   Weight: 156 lb 9.6 oz (71 kg)   Height: 5' 0.25" (1.53 m)        Assessment & Plan:   1. Acute recurrent sinusitis, unspecified location   2. Recurrent acute serous otitis media of both ears   Cont nasal steroid spray  Meds ordered this encounter  Medications  . DISCONTD: clarithromycin (BIAXIN) 250 MG tablet    Sig: Take 1 tablet (250 mg total) by mouth 2 (two) times daily.    Dispense:  20 tablet    Refill:  0  . DISCONTD: predniSONE (DELTASONE) 10 MG tablet    Sig: 6-5-4-3-2-1 tabs po qd taper    Dispense:  21 tablet    Refill:  0  . predniSONE (DELTASONE) 10 MG tablet    Sig: 6-5-4-3-2-1 tabs po qd taper    Dispense:  21 tablet    Refill:  0  . clarithromycin (BIAXIN) 250 MG tablet    Sig: Take 1 tablet (250 mg  total) by mouth 2 (two) times daily.    Dispense:  20 tablet    Refill:  0    I personally performed the services described in this documentation, which was scribed in my presence. The recorded information has been reviewed and considered, and addended by me as needed.   Norberto SorensonEva Shaw, M.D.  [facility]  [place] [address] [place], [place] [address] [phone] phone [phone] fax  09/17/16 11:39 PM

## 2016-09-19 ENCOUNTER — Telehealth: Payer: Self-pay

## 2016-09-19 ENCOUNTER — Ambulatory Visit (INDEPENDENT_AMBULATORY_CARE_PROVIDER_SITE_OTHER): Payer: 59 | Admitting: Family Medicine

## 2016-09-19 VITALS — BP 122/72 | HR 71 | Temp 98.5°F | Resp 17 | Ht 60.0 in | Wt 155.0 lb

## 2016-09-19 DIAGNOSIS — T887XXA Unspecified adverse effect of drug or medicament, initial encounter: Secondary | ICD-10-CM

## 2016-09-19 DIAGNOSIS — T50905A Adverse effect of unspecified drugs, medicaments and biological substances, initial encounter: Secondary | ICD-10-CM

## 2016-09-19 DIAGNOSIS — J0191 Acute recurrent sinusitis, unspecified: Secondary | ICD-10-CM

## 2016-09-19 NOTE — Telephone Encounter (Signed)
PT is coming in for appointment so disregard phone message 10/30

## 2016-09-19 NOTE — Telephone Encounter (Signed)
Pt had a reaction to medication and quit taking it and would like something to take in place of those the clarithromycin and prednisone she is unsure which caused the reaction but called pharmacy and they told her to stop taking them   Best contact [phone]

## 2016-09-19 NOTE — Progress Notes (Signed)
Chief Complaint  Patient presents with  . medication problem    HPI   Pt was here on Saturday, 2 days ago,  She was prescribed clarithromycin and Prednisone. She took a clarithromycin pill at supper. The next day she woke up in a sweat. Sunday morning she took her 40mg  of Prednisone with Clarithromycin She reports that she developed sweats, weakness, and fatigue after taking prednisone.    Past Medical History:  Diagnosis Date  . Allergic rhinitis, cause unspecified    Allergic rhinitis  . Arthritis   . HTN (hypertension)     Current Outpatient Prescriptions  Medication Sig Dispense Refill  . albuterol (PROVENTIL HFA;VENTOLIN HFA) 108 (90 BASE) MCG/ACT inhaler Inhale 2 puffs into the lungs every 6 (six) hours as needed for wheezing. 1 Inhaler 0  . ALPRAZolam (XANAX) 0.25 MG tablet Take 1 tablet (0.25 mg total) by mouth at bedtime as needed for anxiety. 20 tablet 0  . aspirin 81 MG tablet Take 81 mg by mouth daily.    . calcium carbonate (OS-CAL) 1250 MG chewable tablet Chew 1 tablet by mouth daily. Reported on 12/17/2015    . clarithromycin (BIAXIN) 250 MG tablet Take 1 tablet (250 mg total) by mouth 2 (two) times daily. 20 tablet 0  . Cyanocobalamin (VITAMIN B 12 PO) Take by mouth.    . diphenhydrAMINE (BENADRYL) 25 mg capsule Take 25 mg by mouth every 6 (six) hours as needed.    Marland Kitchen. lisinopril (PRINIVIL,ZESTRIL) 40 MG tablet Take 1 tablet (40 mg total) by mouth daily. 90 tablet 3  . loratadine (CLARITIN) 10 MG tablet Take 10 mg by mouth daily. Reported on 12/17/2015    . predniSONE (DELTASONE) 10 MG tablet 6-5-4-3-2-1 tabs po qd taper 21 tablet 0  . triamcinolone (NASACORT AQ) 55 MCG/ACT AERO nasal inhaler Place 2 sprays into the nose daily. (Patient not taking: Reported on 09/19/2016) 1 Inhaler 12   No current facility-administered medications for this visit.     Allergies:  Allergies  Allergen Reactions  . Codeine Nausea And Vomiting  . Sulfonamide Derivatives      Past Surgical History:  Procedure Laterality Date  . CESAREAN SECTION    . DILATION AND CURETTAGE OF UTERUS     times 2    Social History   Social History  . Marital status: Married    Spouse name: N/A  . Number of children: N/A  . Years of education: N/A   Social History Main Topics  . Smoking status: Never Smoker  . Smokeless tobacco: Never Used  . Alcohol use No  . Drug use: No  . Sexual activity: Not on file   Other Topics Concern  . Not on file   Social History Narrative  . No narrative on file    ROS  Objective: Vitals:   09/19/16 0944  BP: 122/72  Pulse: 71  Resp: 17  Temp: 98.5 F (36.9 C)  TempSrc: Oral  SpO2: 98%  Weight: 155 lb (70.3 kg)  Height: 5' (1.524 m)    Physical Exam General: alert, oriented, in NAD Head: normocephalic, atraumatic, no sinus tenderness Eyes: EOM intact, no scleral icterus or conjunctival injection Ears: TM clear bilaterally Throat: no pharyngeal exudate or erythema Lymph: no posterior auricular, submental or cervical lymph adenopathy Heart: normal rate, normal sinus rhythm, no murmurs Lungs: clear to auscultation bilaterally, no wheezing   Assessment and Plan Liborio NixonJanice was seen today for medication problem.  Diagnoses and all orders for this visit:  Adverse effect  of drug, initial encounter- advised pt to stop the Prednisone Explained that she should continue to monitor her symptoms This is not a true allergy but a known adverse reaction   Acute recurrent sinusitis, unspecified location- continue clarithromycin And add rhinocort which patient has at home    Zoe A [employer]

## 2016-09-19 NOTE — Patient Instructions (Signed)
     IF you received an x-ray today, you will receive an invoice from [facility]. Please contact [facility] at [phone] with questions or concerns regarding your invoice.   IF you received labwork today, you will receive an invoice from [facility]. Please contact [facility] at [phone] with questions or concerns regarding your invoice.   Our billing staff will not be able to assist you with questions regarding bills from these companies.  You will be contacted with the lab results as soon as they are available. The fastest way to get your results is to activate your My Chart account. Instructions are located on the last page of this paperwork. If you have not heard from us regarding the results in 2 weeks, please contact this office.      

## 2016-10-20 ENCOUNTER — Ambulatory Visit (INDEPENDENT_AMBULATORY_CARE_PROVIDER_SITE_OTHER): Payer: 59 | Admitting: Family Medicine

## 2016-10-20 VITALS — BP 120/60 | HR 66 | Temp 98.5°F | Resp 17 | Ht 60.0 in | Wt 159.0 lb

## 2016-10-20 DIAGNOSIS — I10 Essential (primary) hypertension: Secondary | ICD-10-CM

## 2016-10-20 DIAGNOSIS — H6993 Unspecified Eustachian tube disorder, bilateral: Secondary | ICD-10-CM

## 2016-10-20 DIAGNOSIS — Z789 Other specified health status: Secondary | ICD-10-CM | POA: Diagnosis not present

## 2016-10-20 DIAGNOSIS — E782 Mixed hyperlipidemia: Secondary | ICD-10-CM

## 2016-10-20 DIAGNOSIS — Z5181 Encounter for therapeutic drug level monitoring: Secondary | ICD-10-CM

## 2016-10-20 LAB — COMPREHENSIVE METABOLIC PANEL
ALT: 19 U/L (ref 6–29)
AST: 24 U/L (ref 10–35)
Albumin: 3.9 g/dL (ref 3.6–5.1)
Alkaline Phosphatase: 93 U/L (ref 33–130)
BUN: 7 mg/dL (ref 7–25)
CHLORIDE: 105 mmol/L (ref 98–110)
CO2: 29 mmol/L (ref 20–31)
CREATININE: 0.69 mg/dL (ref 0.50–0.99)
Calcium: 9.6 mg/dL (ref 8.6–10.4)
GLUCOSE: 97 mg/dL (ref 65–99)
POTASSIUM: 4.4 mmol/L (ref 3.5–5.3)
SODIUM: 142 mmol/L (ref 135–146)
Total Bilirubin: 0.4 mg/dL (ref 0.2–1.2)
Total Protein: 6.7 g/dL (ref 6.1–8.1)

## 2016-10-20 LAB — LIPID PANEL
CHOL/HDL RATIO: 3.3 ratio (ref <5.0)
Cholesterol: 222 mg/dL — ABNORMAL HIGH (ref <200)
HDL: 67 mg/dL (ref >50)
LDL CALC: 132 mg/dL — AB (ref <100)
Triglycerides: 117 mg/dL (ref <150)
VLDL: 23 mg/dL (ref <30)

## 2016-10-20 MED ORDER — LISINOPRIL 40 MG PO TABS: 40.0000 mg | ORAL_TABLET | Freq: Every day | ORAL | 3 refills | Status: DC

## 2016-10-20 NOTE — Progress Notes (Signed)
Chief Complaint  Patient presents with  . Medication Refill    lisinopril  . Follow-up    fluid in ears    HPI  Hypertension: Patient is here for evaluation of elevated blood pressures.  Cardiac symptoms none. Patient denies chest pain, chest pressure/discomfort, claudication and dyspnea.  Cardiovascular risk factors: advanced age (older than 8355 for men, 3565 for women) and hypertension. Use of agents associated with hypertension: none. History of target organ damage: none. Lab Results  Component Value Date   CREATININE 0.80 10/13/2015   Lab Results  Component Value Date   K 4.3 10/13/2015   Hyperlipidemia: Patient presents with hyperlipidemia.  She was tested because of routine screening.   chest pain, dyspnea, exertional chest pressure/discomfort, fatigue, feeding intolerance and lower extremity edema. There is a family history of hyperlipidemia. There is not a family history of early ischemia heart disease.  She has a previous history of statin intolerance after trying 3-4 statins that have caused her to develop myalgia.   Eustachian Abnormalities Pt reports that the full feeling in her ears has gotten better She was previously on steroid and antihistamine She denies otalgia today   Lab Results  Component Value Date   CHOL 235 (H) 10/13/2015   CHOL 214 (H) 12/05/2013   CHOL 263 (H) 01/18/2012   Lab Results  Component Value Date   HDL 57 10/13/2015   HDL 51 12/05/2013   HDL 54 01/18/2012   Lab Results  Component Value Date   LDLCALC 144 (H) 10/13/2015   LDLCALC 140 (H) 12/05/2013   LDLCALC 176 (H) 01/18/2012   Lab Results  Component Value Date   TRIG 169 (H) 10/13/2015   TRIG 113 12/05/2013   TRIG 164 (H) 01/18/2012   Lab Results  Component Value Date   CHOLHDL 4.1 10/13/2015   CHOLHDL 4.2 12/05/2013   CHOLHDL 4.9 01/18/2012   Lab Results  Component Value Date   LDLDIRECT 152.4 01/23/2008   LDLDIRECT 162.7 11/16/2007   LDLDIRECT 174.8 11/08/2006      Past Medical History:  Diagnosis Date  . Allergic rhinitis, cause unspecified    Allergic rhinitis  . Arthritis   . HTN (hypertension)     Current Outpatient Prescriptions  Medication Sig Dispense Refill  . albuterol (PROVENTIL HFA;VENTOLIN HFA) 108 (90 BASE) MCG/ACT inhaler Inhale 2 puffs into the lungs every 6 (six) hours as needed for wheezing. 1 Inhaler 0  . ALPRAZolam (XANAX) 0.25 MG tablet Take 1 tablet (0.25 mg total) by mouth at bedtime as needed for anxiety. 20 tablet 0  . aspirin 81 MG tablet Take 81 mg by mouth daily.    . Cyanocobalamin (VITAMIN B 12 PO) Take by mouth.    . diphenhydrAMINE (BENADRYL) 25 mg capsule Take 25 mg by mouth every 6 (six) hours as needed.    Marland Kitchen. lisinopril (PRINIVIL,ZESTRIL) 40 MG tablet Take 1 tablet (40 mg total) by mouth daily. 90 tablet 3  . triamcinolone (NASACORT AQ) 55 MCG/ACT AERO nasal inhaler Place 2 sprays into the nose daily. 1 Inhaler 12  . loratadine (CLARITIN) 10 MG tablet Take 10 mg by mouth daily. Reported on 12/17/2015     No current facility-administered medications for this visit.     Allergies:  Allergies  Allergen Reactions  . Codeine Nausea And Vomiting  . Statins   . Sulfonamide Derivatives     Past Surgical History:  Procedure Laterality Date  . CESAREAN SECTION    . DILATION AND CURETTAGE OF UTERUS  times 2    Social History   Social History  . Marital status: Married    Spouse name: N/A  . Number of children: N/A  . Years of education: N/A   Social History Main Topics  . Smoking status: Never Smoker  . Smokeless tobacco: Never Used  . Alcohol use No  . Drug use: No  . Sexual activity: Not Asked   Other Topics Concern  . None   Social History Narrative  . None    ROS  Objective: Vitals:   10/20/16 0859 10/20/16 0912  BP: (!) 160/78 120/60  Pulse: 66   Resp: 17   Temp: 98.5 F (36.9 C)   TempSrc: Oral   SpO2: 98%   Weight: 159 lb (72.1 kg)   Height: 5' (1.524 m)      Physical Exam  Constitutional: She is oriented to person, place, and time. She appears well-developed and well-nourished.  HENT:  Head: Normocephalic and atraumatic.  Right Ear: External ear normal.  Left Ear: External ear normal.  Nose: Nose normal.  Mouth/Throat: Oropharynx is clear and moist. No oropharyngeal exudate.  Eyes: Conjunctivae and EOM are normal. Pupils are equal, round, and reactive to light.  Neck: Normal range of motion. Neck supple.  Cardiovascular: Normal rate, regular rhythm, normal heart sounds and intact distal pulses.   No murmur heard. Pulmonary/Chest: Effort normal and breath sounds normal. No respiratory distress. She has no wheezes.  Musculoskeletal: Normal range of motion.  Neurological: She is alert and oriented to person, place, and time.  Skin: Skin is warm. Capillary refill takes less than 2 seconds. No erythema.    Assessment and Plan Sally NixonJanice was seen today for medication refill and follow-up.  Diagnoses and all orders for this visit:  Encounter for medication monitoring- will check labs -     Comprehensive metabolic panel -     Lipid panel  Essential hypertension- bp in good range, well controlled with lisinopril -     lisinopril (PRINIVIL,ZESTRIL) 40 MG tablet; Take 1 tablet (40 mg total) by mouth daily. -     Comprehensive metabolic panel  Mixed hyperlipidemia- will continue to monitor Discussed there are other options if she cannot tolerate statins -     Comprehensive metabolic panel -     Lipid panel  Statin intolerance- will check her ldl today  Disorder of both eustachian tubes- resolved     Sally Bautista

## 2016-10-20 NOTE — Patient Instructions (Addendum)
IF you received an x-ray today, you will receive an invoice from [facility]. Please contact [facility] at [phone] with questions or concerns regarding your invoice.   IF you received labwork today, you will receive an invoice from [facility]. Please contact [facility] at [phone] with questions or concerns regarding your invoice.   Our billing staff will not be able to assist you with questions regarding bills from these companies.  You will be contacted with the lab results as soon as they are available. The fastest way to get your results is to activate your My Chart account. Instructions are located on the last page of this paperwork. If you have not heard from us regarding the results in 2 weeks, please contact this office.     High Cholesterol High cholesterol is a condition in which the blood has high levels of a white, waxy, fat-like substance (cholesterol). The human body needs small amounts of cholesterol. The liver makes all the cholesterol that the body needs. Extra (excess) cholesterol comes from the food that we eat. Cholesterol is carried from the liver by the blood through the blood vessels. If you have high cholesterol, deposits (plaques) may build up on the walls of your blood vessels (arteries). Plaques make the arteries narrower and stiffer. Cholesterol plaques increase your risk for heart attack and stroke. Work with your health care provider to keep your cholesterol levels in a healthy range. What increases the risk? This condition is more likely to develop in people who:  Eat foods that are high in animal fat (saturated fat) or cholesterol.  Are overweight.  Are not getting enough exercise.  Have a family history of high cholesterol. What are the signs or symptoms? There are no symptoms of this condition. How is this diagnosed? This condition may be diagnosed from the results of a blood test.  If you are  older than age 68, your health care provider may check your cholesterol every 4-6 years.  You may be checked more often if you already have high cholesterol or other risk factors for heart disease. The blood test for cholesterol measures:  "Bad" cholesterol (LDL cholesterol). This is the main type of cholesterol that causes heart disease. The desired level for LDL is less than 100.  "Good" cholesterol (HDL cholesterol). This type helps to protect against heart disease by cleaning the arteries and carrying the LDL away. The desired level for HDL is 60 or higher.  Triglycerides. These are fats that the body can store or burn for energy. The desired number for triglycerides is lower than 150.  Total cholesterol. This is a measure of the total amount of cholesterol in your blood, including LDL cholesterol, HDL cholesterol, and triglycerides. A healthy number is less than 200. How is this treated? This condition is treated with diet changes, lifestyle changes, and medicines. Diet changes  This may include eating more whole grains, fruits, vegetables, nuts, and fish.  This may also include cutting back on red meat and foods that have a lot of added sugar. Lifestyle changes  Changes may include getting at least 40 minutes of aerobic exercise 3 times a week. Aerobic exercises include walking, biking, and swimming. Aerobic exercise along with a healthy diet can help you maintain a healthy weight.  Changes may also include quitting smoking. Medicines  Medicines are usually given if diet and lifestyle changes have failed to reduce your cholesterol to healthy levels.  Your health care provider may prescribe  a statin medicine. Statin medicines have been shown to reduce cholesterol, which can reduce the risk of heart disease. Follow these instructions at home: Eating and drinking If told by your health care provider:  Eat chicken (without skin), fish, veal, shellfish, ground Malaw[place]urkey breast, and  round or loin cuts of red meat.  Do not eat fried foods or fatty meats, such as hot dogs and salami.  Eat plenty of fruits, such as apples.  Eat plenty of vegetables, such as broccoli, potatoes, and carrots.  Eat beans, peas, and lentils.  Eat grains such as barley, rice, couscous, and bulgur wheat.  Eat pasta without cream sauces.  Use skim or nonfat milk, and eat low-fat or nonfat yogurt and cheeses.  Do not eat or drink whole milk, cream, ice cream, egg yolks, or hard cheeses.  Do not eat stick margarine or tub margarines that contain trans fats (also called partially hydrogenated oils).  Do not eat saturated tropical oils, such as coconut oil and palm oil.  Do not eat cakes, cookies, crackers, or other baked goods that contain trans fats. General instructions  Exercise as directed by your health care provider. Increase your activity level with activities such as gardening, walking, and taking the stairs.  Take over-the-counter and prescription medicines only as told by your health care provider.  Do not use any products that contain nicotine or tobacco, such as cigarettes and e-cigarettes. If you need help quitting, ask your health care provider.  Keep all follow-up visits as told by your health care provider. This is important. Contact a health care provider if:  You are struggling to maintain a healthy diet or weight.  You need help to start on an exercise program.  You need help to stop smoking. Get help right away if:  You have chest pain.  You have trouble breathing. This information is not intended to replace advice given to you by your health care provider. Make sure you discuss any questions you have with your health care provider. Document Released: 11/07/2005 Document Revised: 06/04/2016 Document Reviewed: 05/07/2016 Elsevier Interactive Patient Education  2017 [employer]

## 2017-10-20 ENCOUNTER — Encounter: Payer: Self-pay | Admitting: Physician Assistant

## 2017-10-20 ENCOUNTER — Other Ambulatory Visit: Payer: Self-pay

## 2017-10-20 ENCOUNTER — Ambulatory Visit (INDEPENDENT_AMBULATORY_CARE_PROVIDER_SITE_OTHER): Payer: Medicare Other | Admitting: Physician Assistant

## 2017-10-20 VITALS — BP 162/78 | HR 71 | Temp 98.0°F | Resp 16 | Ht 60.0 in | Wt 170.0 lb

## 2017-10-20 DIAGNOSIS — I10 Essential (primary) hypertension: Secondary | ICD-10-CM | POA: Diagnosis not present

## 2017-10-20 DIAGNOSIS — E782 Mixed hyperlipidemia: Secondary | ICD-10-CM | POA: Diagnosis not present

## 2017-10-20 MED ORDER — LISINOPRIL 40 MG PO TABS: 40.0000 mg | ORAL_TABLET | Freq: Every day | ORAL | 3 refills | Status: DC

## 2017-10-20 NOTE — Patient Instructions (Addendum)
 DASH Eating Plan DASH stands for "Dietary Approaches to Stop Hypertension." The DASH eating plan is a healthy eating plan that has been shown to reduce high blood pressure (hypertension). It may also reduce your risk for type 2 diabetes, heart disease, and stroke. The DASH eating plan may also help with weight loss. What are tips for following this plan? General guidelines  Avoid eating more than 2,300 mg (milligrams) of salt (sodium) a day. If you have hypertension, you may need to reduce your sodium intake to 1,500 mg a day.  Limit alcohol intake to no more than 1 drink a day for nonpregnant women and 2 drinks a day for men. One drink equals 12 oz of beer, 5 oz of wine, or 1 oz of hard liquor.  Work with your health care provider to maintain a healthy body weight or to lose weight. Ask what an ideal weight is for you.  Get at least 30 minutes of exercise that causes your heart to beat faster (aerobic exercise) most days of the week. Activities may include walking, swimming, or biking.  Work with your health care provider or diet and nutrition specialist (dietitian) to adjust your eating plan to your individual calorie needs. Reading food labels  Check food labels for the amount of sodium per serving. Choose foods with less than 5 percent of the Daily Value of sodium. Generally, foods with less than 300 mg of sodium per serving fit into this eating plan.  To find whole grains, look for the word "whole" as the first word in the ingredient list. Shopping  Buy products labeled as "low-sodium" or "no salt added."  Buy fresh foods. Avoid canned foods and premade or frozen meals. Cooking  Avoid adding salt when cooking. Use salt-free seasonings or herbs instead of table salt or sea salt. Check with your health care provider or pharmacist before using salt substitutes.  Do not fry foods. Cook foods using healthy methods such as baking, boiling, grilling, and broiling instead.  Cook with  heart-healthy oils, such as olive, canola, soybean, or sunflower oil. Meal planning   Eat a balanced diet that includes: ? 5 or more servings of fruits and vegetables each day. At each meal, try to fill half of your plate with fruits and vegetables. ? Up to 6-8 servings of whole grains each day. ? Less than 6 oz of lean meat, poultry, or fish each day. A 3-oz serving of meat is about the same size as a deck of cards. One egg equals 1 oz. ? 2 servings of low-fat dairy each day. ? A serving of nuts, seeds, or beans 5 times each week. ? Heart-healthy fats. Healthy fats called Omega-3 fatty acids are found in foods such as flaxseeds and coldwater fish, like sardines, salmon, and mackerel.  Limit how much you eat of the following: ? Canned or prepackaged foods. ? Food that is high in trans fat, such as fried foods. ? Food that is high in saturated fat, such as fatty meat. ? Sweets, desserts, sugary drinks, and other foods with added sugar. ? Full-fat dairy products.  Do not salt foods before eating.  Try to eat at least 2 vegetarian meals each week.  Eat more home-cooked food and less restaurant, buffet, and fast food.  When eating at a restaurant, ask that your food be prepared with less salt or no salt, if possible. What foods are recommended? The items listed may not be a complete list. Talk with your dietitian about   what dietary choices are best for you. Grains Whole-grain or whole-wheat bread. Whole-grain or whole-wheat pasta. Brown rice. Oatmeal. Quinoa. Bulgur. Whole-grain and low-sodium cereals. Pita bread. Low-fat, low-sodium crackers. Whole-wheat flour tortillas. Vegetables Fresh or frozen vegetables (raw, steamed, roasted, or grilled). Low-sodium or reduced-sodium tomato and vegetable juice. Low-sodium or reduced-sodium tomato sauce and tomato paste. Low-sodium or reduced-sodium canned vegetables. Fruits All fresh, dried, or frozen fruit. Canned fruit in natural juice (without  added sugar). Meat and other protein foods Skinless chicken or turkey. Ground chicken or turkey. Pork with fat trimmed off. Fish and seafood. Egg whites. Dried beans, peas, or lentils. Unsalted nuts, nut butters, and seeds. Unsalted canned beans. Lean cuts of beef with fat trimmed off. Low-sodium, lean deli meat. Dairy Low-fat (1%) or fat-free (skim) milk. Fat-free, low-fat, or reduced-fat cheeses. Nonfat, low-sodium ricotta or cottage cheese. Low-fat or nonfat yogurt. Low-fat, low-sodium cheese. Fats and oils Soft margarine without trans fats. Vegetable oil. Low-fat, reduced-fat, or light mayonnaise and salad dressings (reduced-sodium). Canola, safflower, olive, soybean, and sunflower oils. Avocado. Seasoning and other foods Herbs. Spices. Seasoning mixes without salt. Unsalted popcorn and pretzels. Fat-free sweets. What foods are not recommended? The items listed may not be a complete list. Talk with your dietitian about what dietary choices are best for you. Grains Baked goods made with fat, such as croissants, muffins, or some breads. Dry pasta or rice meal packs. Vegetables Creamed or fried vegetables. Vegetables in a cheese sauce. Regular canned vegetables (not low-sodium or reduced-sodium). Regular canned tomato sauce and paste (not low-sodium or reduced-sodium). Regular tomato and vegetable juice (not low-sodium or reduced-sodium). Pickles. Olives. Fruits Canned fruit in a light or heavy syrup. Fried fruit. Fruit in cream or butter sauce. Meat and other protein foods Fatty cuts of meat. Ribs. Fried meat. Bacon. Sausage. Bologna and other processed lunch meats. Salami. Fatback. Hotdogs. Bratwurst. Salted nuts and seeds. Canned beans with added salt. Canned or smoked fish. Whole eggs or egg yolks. Chicken or turkey with skin. Dairy Whole or 2% milk, cream, and half-and-half. Whole or full-fat cream cheese. Whole-fat or sweetened yogurt. Full-fat cheese. Nondairy creamers. Whipped toppings.  Processed cheese and cheese spreads. Fats and oils Butter. Stick margarine. Lard. Shortening. Ghee. Bacon fat. Tropical oils, such as coconut, palm kernel, or palm oil. Seasoning and other foods Salted popcorn and pretzels. Onion salt, garlic salt, seasoned salt, table salt, and sea salt. Worcestershire sauce. Tartar sauce. Barbecue sauce. Teriyaki sauce. Soy sauce, including reduced-sodium. Steak sauce. Canned and packaged gravies. Fish sauce. Oyster sauce. Cocktail sauce. Horseradish that you find on the shelf. Ketchup. Mustard. Meat flavorings and tenderizers. Bouillon cubes. Hot sauce and Tabasco sauce. Premade or packaged marinades. Premade or packaged taco seasonings. Relishes. Regular salad dressings. Where to find more information:  National Heart, Lung, and Blood Institute: www.nhlbi.nih.gov  American Heart Association: www.heart.org Summary  The DASH eating plan is a healthy eating plan that has been shown to reduce high blood pressure (hypertension). It may also reduce your risk for type 2 diabetes, heart disease, and stroke.  With the DASH eating plan, you should limit salt (sodium) intake to 2,300 mg a day. If you have hypertension, you may need to reduce your sodium intake to 1,500 mg a day.  When on the DASH eating plan, aim to eat more fresh fruits and vegetables, whole grains, lean proteins, low-fat dairy, and heart-healthy fats.  Work with your health care provider or diet and nutrition specialist (dietitian) to adjust your eating plan to your   individual calorie needs. This information is not intended to replace advice given to you by your health care provider. Make sure you discuss any questions you have with your health care provider. Document Released: 10/27/2011 Document Revised: 10/31/2016 Document Reviewed: 10/31/2016 Elsevier Interactive Patient Education  2017 Elsevier Inc.    IF you received an x-ray today, you will receive an invoice from [facility].  Please contact [facility] at [phone] with questions or concerns regarding your invoice.   IF you received labwork today, you will receive an invoice from [facility]. Please contact [facility] at [phone] with questions or concerns regarding your invoice.   Our billing staff will not be able to assist you with questions regarding bills from these companies.  You will be contacted with the lab results as soon as they are available. The fastest way to get your results is to activate your My Chart account. Instructions are located on the last page of this paperwork. If you have not heard from us regarding the results in 2 weeks, please contact this office.     

## 2017-10-20 NOTE — Progress Notes (Signed)
PRIMARY CARE AT [facility] [address] [place] [address] [phone]  Date:  10/20/2017   Name:  Sally Bautista   DOB:  November 07, 1946   MRN:  [id number]  PCP:  Hayden Rasmussen, MD    History of Present Illness:  Sally Bautista is a 71 y.o. female patient who presents to PCP with  Chief Complaint  Patient presents with  . Medication Refill    lisinopril     Patient is here today for medication She notes that she has not had her bp medication today. No chest pains, palpitations, sob or dizziness.   She is attempting to avoid a lot of fried.  But does eat red meat.  She does not eat fish, due to increasing scratchy throat when she is around or consumes fish.   Also reports a couple days ago she fell and hit her face on the hard floor, as she was getting out of bed.  Her foot was caught in the covers, and she was not able to catch herself.  Not much pain, and healing but bruising apparent.   Attempts to watch her sodium intake  Patient Active Problem List   Diagnosis Date Noted  . HYPERGLYCEMIA, FASTING 01/29/2008  . Mixed hyperlipidemia 11/16/2007  . HYPERTENSION 11/16/2007  . OSTEOARTHRITIS 11/16/2007    Past Medical History:  Diagnosis Date  . Allergic rhinitis, cause unspecified    Allergic rhinitis  . Arthritis   . HTN (hypertension)     Past Surgical History:  Procedure Laterality Date  . CESAREAN SECTION    . DILATION AND CURETTAGE OF UTERUS     times 2    Social History   Tobacco Use  . Smoking status: Never Smoker  . Smokeless tobacco: Never Used  Substance Use Topics  . Alcohol use: No  . Drug use: No    Family History  Problem Relation Age of Onset  . Arthritis Mother   . Asthma Daughter   . Diabetes Brother     Allergies  Allergen Reactions  . Codeine Nausea And Vomiting  . Statins   . Sulfonamide Derivatives     Medication list has been reviewed and updated.  Current Outpatient Medications on File Prior to Visit  Medication Sig  Dispense Refill  . albuterol (PROVENTIL HFA;VENTOLIN HFA) 108 (90 BASE) MCG/ACT inhaler Inhale 2 puffs into the lungs every 6 (six) hours as needed for wheezing. 1 Inhaler 0  . aspirin 81 MG tablet Take 81 mg by mouth daily.    . Cyanocobalamin (VITAMIN B 12 PO) Take by mouth.    . diphenhydrAMINE (BENADRYL) 25 mg capsule Take 25 mg by mouth every 6 (six) hours as needed.    Marland Kitchen lisinopril (PRINIVIL,ZESTRIL) 40 MG tablet Take 1 tablet (40 mg total) by mouth daily. 90 tablet 3  . ALPRAZolam (XANAX) 0.25 MG tablet Take 1 tablet (0.25 mg total) by mouth at bedtime as needed for anxiety. (Patient not taking: Reported on 10/20/2017) 20 tablet 0  . loratadine (CLARITIN) 10 MG tablet Take 10 mg by mouth daily. Reported on 12/17/2015    . triamcinolone (NASACORT AQ) 55 MCG/ACT AERO nasal inhaler Place 2 sprays into the nose daily. (Patient not taking: Reported on 10/20/2017) 1 Inhaler 12   No current facility-administered medications on file prior to visit.     ROS ROS otherwise unremarkable unless listed above.  Physical Examination: BP (!) 162/78   Pulse 71   Temp 98 F (36.7 C) (Oral)  Resp 16   Ht 5' (1.524 m)   Wt 170 lb (77.1 kg)   SpO2 98%   BMI 33.20 kg/m  Ideal Body Weight: Weight in (lb) to have BMI = 25: 127.7  Physical Exam  Constitutional: She is oriented to person, place, and time. She appears well-developed and well-nourished. No distress.  HENT:  Head: Normocephalic and atraumatic.  Right Ear: External ear normal.  Left Ear: External ear normal.  Eyes: Conjunctivae and EOM are normal. Pupils are equal, round, and reactive to light.  Medial right upper orbital bone mildly tender without crepitus.  ecchmosis apparent on the right eye orbit, with some at the left medial.  Normal eye movement.    Cardiovascular: Normal rate.  Pulmonary/Chest: Effort normal. No respiratory distress.  Neurological: She is alert and oriented to person, place, and time.  Skin: She is not  diaphoretic.  Psychiatric: She has a normal mood and affect. Her behavior is normal.     Assessment and Plan: Sally Bautista is a 71 y.o. female who is here today for cc of  Chief Complaint  Patient presents with  . Medication Refill    lisinopril   Mixed hyperlipidemia - Plan: Lipid panel  Essential hypertension - Plan: Lipid panel, CMP14+EGFR, lisinopril (PRINIVIL,ZESTRIL) 40 MG tablet  Ivar Drape, PA-C [facility] 12/4/20189:30 AM

## 2017-10-21 LAB — CMP14+EGFR
A/G RATIO: 1.4 (ref 1.2–2.2)
ALBUMIN: 4.2 g/dL (ref 3.6–4.8)
ALT: 21 IU/L (ref 0–32)
AST: 28 IU/L (ref 0–40)
Alkaline Phosphatase: 90 IU/L (ref 39–117)
BUN/Creatinine Ratio: 10 — ABNORMAL LOW (ref 12–28)
BUN: 9 mg/dL (ref 8–27)
Bilirubin Total: 0.4 mg/dL (ref 0.0–1.2)
CALCIUM: 9.9 mg/dL (ref 8.7–10.3)
CO2: 22 mmol/L (ref 20–29)
Chloride: 104 mmol/L (ref 96–106)
Creatinine, Ser: 0.86 mg/dL (ref 0.57–1.00)
GFR, EST AFRICAN AMERICAN: 80 mL/min/{1.73_m2} (ref >59)
GFR, EST NON AFRICAN AMERICAN: 69 mL/min/{1.73_m2} (ref >59)
GLOBULIN, TOTAL: 2.9 g/dL (ref 1.5–4.5)
Glucose: 102 mg/dL — ABNORMAL HIGH (ref 65–99)
POTASSIUM: 4.5 mmol/L (ref 3.5–5.2)
Sodium: 144 mmol/L (ref 134–144)
TOTAL PROTEIN: 7.1 g/dL (ref 6.0–8.5)

## 2017-10-21 LAB — LIPID PANEL
CHOL/HDL RATIO: 3.8 ratio (ref 0.0–4.4)
Cholesterol, Total: 219 mg/dL — ABNORMAL HIGH (ref 100–199)
HDL: 57 mg/dL (ref >39)
LDL Calculated: 138 mg/dL — ABNORMAL HIGH (ref 0–99)
TRIGLYCERIDES: 121 mg/dL (ref 0–149)
VLDL Cholesterol Cal: 24 mg/dL (ref 5–40)

## 2017-10-24 ENCOUNTER — Telehealth: Payer: Self-pay | Admitting: Physician Assistant

## 2017-10-24 NOTE — Telephone Encounter (Signed)
Patient informed. 

## 2017-10-24 NOTE — Telephone Encounter (Unsigned)
Copied from CRM [id number]. Topic: Quick Communication - See Telephone Encounter >> Oct 24, 2017 12:57 PM Eston Mouldavis, Thomas Rhude B wrote: CRM for notification. See Telephone encounter for PT asks lab results 10/24/17.

## 2017-11-08 NOTE — Progress Notes (Signed)
Left message for pt per note from [place] PA.

## 2018-02-20 ENCOUNTER — Encounter: Payer: Self-pay | Admitting: Physician Assistant

## 2018-10-16 ENCOUNTER — Ambulatory Visit: Payer: Medicare Other | Admitting: Family Medicine

## 2018-10-16 ENCOUNTER — Encounter: Payer: Self-pay | Admitting: Family Medicine

## 2018-10-16 ENCOUNTER — Other Ambulatory Visit: Payer: Self-pay

## 2018-10-16 VITALS — BP 149/79 | HR 75 | Temp 98.0°F | Resp 17 | Ht 60.0 in | Wt 152.4 lb

## 2018-10-16 DIAGNOSIS — Z5181 Encounter for therapeutic drug level monitoring: Secondary | ICD-10-CM | POA: Diagnosis not present

## 2018-10-16 DIAGNOSIS — Z1239 Encounter for other screening for malignant neoplasm of breast: Secondary | ICD-10-CM | POA: Diagnosis not present

## 2018-10-16 DIAGNOSIS — E782 Mixed hyperlipidemia: Secondary | ICD-10-CM | POA: Diagnosis not present

## 2018-10-16 DIAGNOSIS — J4520 Mild intermittent asthma, uncomplicated: Secondary | ICD-10-CM

## 2018-10-16 DIAGNOSIS — Z789 Other specified health status: Secondary | ICD-10-CM

## 2018-10-16 DIAGNOSIS — Z1211 Encounter for screening for malignant neoplasm of colon: Secondary | ICD-10-CM

## 2018-10-16 DIAGNOSIS — I10 Essential (primary) hypertension: Secondary | ICD-10-CM | POA: Diagnosis not present

## 2018-10-16 MED ORDER — ALBUTEROL SULFATE HFA 108 (90 BASE) MCG/ACT IN AERS: 2.0000 | INHALATION_SPRAY | Freq: Four times a day (QID) | RESPIRATORY_TRACT | 0 refills | Status: DC | PRN

## 2018-10-16 MED ORDER — LISINOPRIL 40 MG PO TABS: 40.0000 mg | ORAL_TABLET | Freq: Every day | ORAL | 3 refills | Status: DC

## 2018-10-16 NOTE — Patient Instructions (Addendum)
We recommend that you schedule a mammogram for breast cancer screening. Typically, you do not need a referral to do this. Please contact a local imaging center to schedule your mammogram.  [facility] - [phone]  *ask for the Radiology Department [facility] - [phone] or [phone]  [facility] - [phone] [facility] - [phone] [facility] - [phone]  *ask for the Radiology Department [facility] - [phone]  *ask for the Radiology Department [facility] - [phone]  *ask for the Mammography Department [facility] - [phone]     If you have lab work done today you will be contacted with your lab results within the next 2 weeks.  If you have not heard from us then please contact us. The fastest way to get your results is to register for My Chart.   IF you received an x-ray today, you will receive an invoice from [facility]. Please contact  Radiology at [phone] with questions or concerns regarding your invoice.   IF you received labwork today, you will receive an invoice from [facility]. Please contact [facility] at [phone] with questions or concerns regarding your invoice.   Our billing staff will not be able to assist you with questions regarding bills from these companies.  You will be contacted with the lab results as soon as they are available. The fastest way to get your results is to activate your My Chart account. Instructions are located on the last page of this paperwork. If you have not heard from us regarding the results in 2 weeks, please contact this office.     Colorectal Cancer Screening Colorectal cancer screening is a group of tests used to check for colorectal cancer. Colorectal refers to your colon and rectum. Your colon and rectum are located at the end of your large intestine and carry your bowel  movements out of your body. Why is colorectal cancer screening done? It is common for abnormal growths (polyps) to form in the lining of your colon, especially as you get older. These polyps can be cancerous or become cancerous. If colorectal cancer is found at an early stage, it is treatable. Who should be screened for colorectal cancer? Screening is recommended for all adults at average risk starting at age 50. Tests may be recommended every 1 to 10 years. Your health care provider may recommend earlier or more frequent screening if you have:  A history of colorectal cancer or polyps.  A family member with a history of colorectal cancer or polyps.  Inflammatory bowel disease, such as ulcerative colitis or Crohn disease.  A type of hereditary colon cancer syndrome.  Colorectal cancer symptoms.  Types of screening tests There are several types of colorectal screening tests. They include:  Guaiac-based fecal occult blood testing.  Fecal immunochemical test (FIT).  Stool DNA test.  Barium enema.  Virtual colonoscopy.  Sigmoidoscopy. During this test, a sigmoidoscope is used to examine your rectum and lower colon. A sigmoidoscope is a flexible tube with a camera that is inserted through your anus into your rectum and lower colon.  Colonoscopy. During this test, a colonoscope is used to examine your entire colon. A colonoscope is a long, thin, flexible tube with a camera. This test examines your entire colon and rectum.  This information is not intended to replace advice given to you by your health care provider. Make sure you discuss any questions you   have with your health care provider. Document Released: 04/27/2010 Document Revised: 06/16/2016 Document Reviewed: 02/13/2014 Elsevier Interactive Patient Education  2018 Elsevier Inc.  

## 2018-10-16 NOTE — Progress Notes (Signed)
Chief Complaint  Patient presents with  . Medication Refill    lisinopril, albuterol    HPI   Hypertension: Patient here for follow-up of elevated blood pressure. She is exercising and is adherent to low salt diet.  Blood pressure is well controlled at home. Cardiac symptoms none. Patient denies chest pain, chest pressure/discomfort, claudication, exertional chest pressure/discomfort, fatigue and lower extremity edema.  Cardiovascular risk factors: dyslipidemia and hypertension. Use of agents associated with hypertension: none. History of target organ damage: none. BP Readings from Last 3 Encounters:  10/16/18 (!) 149/79  10/20/17 (!) 162/78  10/20/16 120/60   Dyslipidemia: Patient presents for evaluation of lipids.    A repeat fasting lipid profile was done.  The patient does not use medications that may worsen dyslipidemias (corticosteroids, progestins, anabolic steroids, diuretics, beta-blockers, amiodarone, cyclosporine, olanzapine). The patient exercises intermittently. The patient is not known to have coexisting coronary artery disease.   She has a history of statin intolerance and was managing her cholesterol with exercise, healthy diet and flax seed oil. Lab Results  Component Value Date   CHOL 219 (H) 10/20/2017   CHOL 222 (H) 10/20/2016   CHOL 235 (H) 10/13/2015   Lab Results  Component Value Date   HDL 57 10/20/2017   HDL 67 10/20/2016   HDL 57 10/13/2015   Lab Results  Component Value Date   LDLCALC 138 (H) 10/20/2017   LDLCALC 132 (H) 10/20/2016   LDLCALC 144 (H) 10/13/2015   Lab Results  Component Value Date   TRIG 121 10/20/2017   TRIG 117 10/20/2016   TRIG 169 (H) 10/13/2015   Lab Results  Component Value Date   CHOLHDL 3.8 10/20/2017   CHOLHDL 3.3 10/20/2016   CHOLHDL 4.1 10/13/2015   Lab Results  Component Value Date   LDLDIRECT 152.4 01/23/2008   LDLDIRECT 162.7 11/16/2007   LDLDIRECT 174.8 11/08/2006   Colon Cancer Screening she has never  had a colonoscopy She denies blood in his stool, unexpected weight loss or pain with defecation No rectal itching right now but sometimes gets itching with her hemorrhoids she does not smoke she does not have a family history of colon cancer   Asthma Reports that her asthma is mild and she rarely uses her albuterol Her triggers are fragrances and scents She denies recent asthma flare   Past Medical History:  Diagnosis Date  . Allergic rhinitis, cause unspecified    Allergic rhinitis  . Arthritis   . HTN (hypertension)     Current Outpatient Medications  Medication Sig Dispense Refill  . albuterol (PROVENTIL HFA;VENTOLIN HFA) 108 (90 Base) MCG/ACT inhaler Inhale 2 puffs into the lungs every 6 (six) hours as needed for wheezing. 1 Inhaler 0  . aspirin 81 MG tablet Take 81 mg by mouth daily.    Marland Kitchen lisinopril (PRINIVIL,ZESTRIL) 40 MG tablet Take 1 tablet (40 mg total) by mouth daily. 90 tablet 3  . Cyanocobalamin (VITAMIN B 12 PO) Take by mouth.    . loratadine (CLARITIN) 10 MG tablet Take 10 mg by mouth daily. Reported on 12/17/2015    . triamcinolone (NASACORT AQ) 55 MCG/ACT AERO nasal inhaler Place 2 sprays into the nose daily. (Patient not taking: Reported on 10/20/2017) 1 Inhaler 12   No current facility-administered medications for this visit.     Allergies:  Allergies  Allergen Reactions  . Codeine Nausea And Vomiting  . Statins   . Sulfonamide Derivatives     Past Surgical History:  Procedure Laterality Date  .  CESAREAN SECTION    . DILATION AND CURETTAGE OF UTERUS     times 2    Social History   Socioeconomic History  . Marital status: Married    Spouse name: Not on file  . Number of children: Not on file  . Years of education: Not on file  . Highest education level: Not on file  Occupational History  . Not on file  Social Needs  . Financial resource strain: Not on file  . Food insecurity:    Worry: Not on file    Inability: Not on file  .  Transportation needs:    Medical: Not on file    Non-medical: Not on file  Tobacco Use  . Smoking status: Never Smoker  . Smokeless tobacco: Never Used  Substance and Sexual Activity  . Alcohol use: No  . Drug use: No  . Sexual activity: Not on file  Lifestyle  . Physical activity:    Days per week: Not on file    Minutes per session: Not on file  . Stress: Not on file  Relationships  . Social connections:    Talks on phone: Not on file    Gets together: Not on file    Attends religious service: Not on file    Active member of club or organization: Not on file    Attends meetings of clubs or organizations: Not on file    Relationship status: Not on file  Other Topics Concern  . Not on file  Social History Narrative  . Not on file    Family History  Problem Relation Age of Onset  . Arthritis Mother   . Asthma Daughter   . Diabetes Brother      ROS Review of Systems See HPI Constitution: No fevers or chills No malaise No diaphoresis Skin: No rash or itching Eyes: no blurry vision, no double vision GU: no dysuria or hematuria Neuro: no dizziness or headaches all others reviewed and negative   Objective: Vitals:   10/16/18 1142  BP: (!) 149/79  Pulse: 75  Resp: 17  Temp: 98 F (36.7 C)  TempSrc: Oral  SpO2: 99%  Weight: 152 lb 6.4 oz (69.1 kg)  Height: 5' (1.524 m)    Physical Exam Physical Exam  Constitutional: He is oriented to person, place, and time. He appears well-developed and well-nourished.  HENT:  Head: Normocephalic and atraumatic.  Eyes: Conjunctivae and EOM are normal.  Cardiovascular: Normal rate, regular rhythm, normal heart sounds and intact distal pulses.  No murmur heard. Pulmonary/Chest: Effort normal and breath sounds normal. No stridor. No respiratory distress. He has no wheezes.  Abdomen: nondistended, normoactive bs, soft, nontender Neurological: He is alert and oriented to person, place, and time.  Skin: Skin is warm.  Capillary refill takes less than 2 seconds.  Psychiatric: He has a normal mood and affect. His behavior is normal. Judgment and thought content normal.   Assessment and Plan Sally Bautista was seen today for medication refill.  Diagnoses and all orders for this visit:  Essential hypertension- Patient's blood pressure is at goal of 139/89 or less. Condition is stable. Continue current medications and treatment plan. I recommend that you exercise for 30-45 minutes 5 days a week. I also recommend a balanced diet with fruits and vegetables every day, lean meats, and little fried foods. The DASH diet (you can find this online) is a good example of this.  -     CMP14+EGFR -     Lipid  Panel -     lisinopril (PRINIVIL,ZESTRIL) 40 MG tablet; Take 1 tablet (40 mg total) by mouth daily.  Screening for breast cancer -     MM Digital Screening; Future  Mixed hyperlipidemia- Discussed medications that affect lipids Reminded patient to avoid grapefruits Reviewed last 3 lipids Discussed current meds: statin, aspirin Advised dietary fiber and fish oil and ways to keep HDL high CAD prevention and reviewed side effects of statins  -     CMP14+EGFR -     Lipid Panel  Encounter for medication monitoring- will check renal function and lipids  Statin intolerance- discussed that she should continue natural options like flax seed Discussed that she can take crestor once or twice a week as a trial  Special screening for malignant neoplasms, colon- discussed colon cancer screening Pt opted for cologuard -     Cologuard  Well controlled intermittent asthma -     albuterol (PROVENTIL HFA;VENTOLIN HFA) 108 (90 Base) MCG/ACT inhaler; Inhale 2 puffs into the lungs every 6 (six) hours as needed for wheezing.     [place]

## 2018-10-17 LAB — CMP14+EGFR
ALBUMIN: 4.2 g/dL (ref 3.5–4.8)
ALK PHOS: 90 IU/L (ref 39–117)
ALT: 14 IU/L (ref 0–32)
AST: 22 IU/L (ref 0–40)
Albumin/Globulin Ratio: 1.6 (ref 1.2–2.2)
BILIRUBIN TOTAL: 0.5 mg/dL (ref 0.0–1.2)
BUN / CREAT RATIO: 7 — AB (ref 12–28)
BUN: 6 mg/dL — AB (ref 8–27)
CHLORIDE: 102 mmol/L (ref 96–106)
CO2: 25 mmol/L (ref 20–29)
Calcium: 10.1 mg/dL (ref 8.7–10.3)
Creatinine, Ser: 0.86 mg/dL (ref 0.57–1.00)
GFR calc Af Amer: 79 mL/min/{1.73_m2} (ref >59)
GFR calc non Af Amer: 69 mL/min/{1.73_m2} (ref >59)
GLUCOSE: 88 mg/dL (ref 65–99)
Globulin, Total: 2.7 g/dL (ref 1.5–4.5)
POTASSIUM: 4.1 mmol/L (ref 3.5–5.2)
Sodium: 142 mmol/L (ref 134–144)
Total Protein: 6.9 g/dL (ref 6.0–8.5)

## 2018-10-17 LAB — LIPID PANEL
CHOLESTEROL TOTAL: 224 mg/dL — AB (ref 100–199)
Chol/HDL Ratio: 3.4 ratio (ref 0.0–4.4)
HDL: 66 mg/dL (ref >39)
LDL Calculated: 126 mg/dL — ABNORMAL HIGH (ref 0–99)
Triglycerides: 158 mg/dL — ABNORMAL HIGH (ref 0–149)
VLDL CHOLESTEROL CAL: 32 mg/dL (ref 5–40)

## 2019-09-23 ENCOUNTER — Encounter: Payer: Self-pay | Admitting: Family Medicine

## 2019-09-23 ENCOUNTER — Other Ambulatory Visit: Payer: Self-pay

## 2019-09-23 ENCOUNTER — Telehealth (INDEPENDENT_AMBULATORY_CARE_PROVIDER_SITE_OTHER): Payer: Medicare Other | Admitting: Family Medicine

## 2019-09-23 DIAGNOSIS — E782 Mixed hyperlipidemia: Secondary | ICD-10-CM | POA: Diagnosis not present

## 2019-09-23 DIAGNOSIS — I10 Essential (primary) hypertension: Secondary | ICD-10-CM

## 2019-09-23 DIAGNOSIS — Z1159 Encounter for screening for other viral diseases: Secondary | ICD-10-CM | POA: Diagnosis not present

## 2019-09-23 DIAGNOSIS — J4520 Mild intermittent asthma, uncomplicated: Secondary | ICD-10-CM | POA: Diagnosis not present

## 2019-09-23 MED ORDER — ALBUTEROL SULFATE HFA 108 (90 BASE) MCG/ACT IN AERS: 2.0000 | INHALATION_SPRAY | Freq: Four times a day (QID) | RESPIRATORY_TRACT | 2 refills | Status: AC | PRN

## 2019-09-23 MED ORDER — LISINOPRIL 40 MG PO TABS: 40.0000 mg | ORAL_TABLET | Freq: Every day | ORAL | 0 refills | Status: DC

## 2019-09-23 NOTE — Progress Notes (Signed)
Telemedicine Encounter- SOAP NOTE Established Patient  This telephone encounter was conducted with the patient's (or proxy's) verbal consent via audio telecommunications: yes/no: Yes Patient was instructed to have this encounter in a suitably private space; and to only have persons present to whom they give permission to participate. In addition, patient identity was confirmed by use of name plus two identifiers (DOB and address).  I discussed the limitations, risks, security and privacy concerns of performing an evaluation and management service by telephone and the availability of in person appointments. I also discussed with the patient that there may be a patient responsible charge related to this service. The patient expressed understanding and agreed to proceed.  I spent a total of TIME; 0 MIN TO 60 MIN: 15 minutes talking with the patient or their proxy.  CC: controlled asthma and hypertension   Subjective   Sally Bautista is a 73 y.o. established patient. Telephone visit today for  HPI    Hypertension: Patient here for follow-up of elevated blood pressure. Patient reports that her blood pressure has not been checked in a while but she is running low in meds She denies chest pain, palpitaitons, sob, LE edema or dry cough She is sticking to a DASH diet BP Readings from Last 3 Encounters:  10/16/18 (!) 149/79  10/20/17 (!) 162/78  10/20/16 120/60   Lab Results  Component Value Date   CREATININE 0.86 10/16/2018   Asthma Reports that she uses her albuterol only 2 times a month And is doing very well with her asthma No shortness of breath or wheezing No cough  She needs a refill   Patient Active Problem List   Diagnosis Date Noted  . HYPERGLYCEMIA, FASTING 01/29/2008  . Mixed hyperlipidemia 11/16/2007  . HYPERTENSION 11/16/2007  . OSTEOARTHRITIS 11/16/2007    Past Medical History:  Diagnosis Date  . Allergic rhinitis, cause unspecified    Allergic rhinitis  .  Arthritis   . HTN (hypertension)     Current Outpatient Medications  Medication Sig Dispense Refill  . albuterol (VENTOLIN HFA) 108 (90 Base) MCG/ACT inhaler Inhale 2 puffs into the lungs every 6 (six) hours as needed for wheezing. 18 g 2  . aspirin 81 MG tablet Take 81 mg by mouth.     . Cyanocobalamin (VITAMIN B 12 PO) Take by mouth.    . diphenhydrAMINE HCl (BENADRYL ALLERGY PO) Take by mouth.    Marland Kitchen lisinopril (ZESTRIL) 40 MG tablet Take 1 tablet (40 mg total) by mouth daily. 90 tablet 0  . Multiple Vitamin (MULTIVITAMIN) tablet Take 1 tablet by mouth daily.     No current facility-administered medications for this visit.     Allergies  Allergen Reactions  . Codeine Nausea And Vomiting  . Statins   . Sulfonamide Derivatives     Social History   Socioeconomic History  . Marital status: Married    Spouse name: Not on file  . Number of children: Not on file  . Years of education: Not on file  . Highest education level: Not on file  Occupational History  . Not on file  Social Needs  . Financial resource strain: Not on file  . Food insecurity    Worry: Not on file    Inability: Not on file  . Transportation needs    Medical: Not on file    Non-medical: Not on file  Tobacco Use  . Smoking status: Never Smoker  . Smokeless tobacco: Never Used  Substance and  Sexual Activity  . Alcohol use: No  . Drug use: No  . Sexual activity: Not on file  Lifestyle  . Physical activity    Days per week: Not on file    Minutes per session: Not on file  . Stress: Not on file  Relationships  . Social connections    Talks on phone: Not on file    Gets together: Not on file    Attends religious service: Not on file    Active member of club or organization: Not on file    Attends meetings of clubs or organizations: Not on file    Relationship status: Not on file  . Intimate partner violence    Fear of current or ex partner: Not on file    Emotionally abused: Not on file     Physically abused: Not on file    Forced sexual activity: Not on file  Other Topics Concern  . Not on file  Social History Narrative  . Not on file    ROS  Review of Systems  Constitutional: Negative for activity change, appetite change, chills and fever.  HENT: Negative for congestion, nosebleeds, trouble swallowing and voice change.   Respiratory: Negative for cough, shortness of breath and wheezing.   Gastrointestinal: Negative for diarrhea, nausea and vomiting.  Genitourinary: Negative for difficulty urinating, dysuria, flank pain and hematuria.  Musculoskeletal: Negative for back pain, joint swelling and neck pain.  Neurological: Negative for dizziness, speech difficulty, light-headedness and numbness.  See HPI. All other review of systems negative.    Objective   Vitals as reported by the patient: There were no vitals filed for this visit.  Diagnoses and all orders for this visit:  Essential hypertension- refilled meds -     CMP14+EGFR; Future -     Lipid panel; Future -     lisinopril (ZESTRIL) 40 MG tablet; Take 1 tablet (40 mg total) by mouth daily.  Mixed hyperlipidemia- will monitor -     CMP14+EGFR; Future -     Lipid panel; Future  Well controlled intermittent asthma- stable, well controlled  -     albuterol (VENTOLIN HFA) 108 (90 Base) MCG/ACT inhaler; Inhale 2 puffs into the lungs every 6 (six) hours as needed for wheezing.  Need for hepatitis C screening test -     HCV Ab w/Rflx to Verification; Future     I discussed the assessment and treatment plan with the patient. The patient was provided an opportunity to ask questions and all were answered. The patient agreed with the plan and demonstrated an understanding of the instructions.   The patient was advised to call back or seek an in-person evaluation if the symptoms worsen or if the condition fails to improve as anticipated.  I provided 15 minutes of non-face-to-face time during this encounter.   Zoe A Stallings, MD  [facility] at [facility]   

## 2019-09-23 NOTE — Patient Instructions (Addendum)
° ° ° °  If you have lab work done today you will be contacted with your lab results within the next 2 weeks.  If you have not heard from us then please contact us. The fastest way to get your results is to register for My Chart. ° ° °IF you received an x-ray today, you will receive an invoice from [facility]. Please contact [facility] at [phone] with questions or concerns regarding your invoice.  ° °IF you received labwork today, you will receive an invoice from [facility]. Please contact [facility] at [phone] with questions or concerns regarding your invoice.  ° °Our billing staff will not be able to assist you with questions regarding bills from these companies. ° °You will be contacted with the lab results as soon as they are available. The fastest way to get your results is to activate your My Chart account. Instructions are located on the last page of this paperwork. If you have not heard from us regarding the results in 2 weeks, please contact this office. °  ° ° ° °

## 2019-09-23 NOTE — Progress Notes (Signed)
Med refills Lisinopril and Albuterol inhaler

## 2019-10-01 ENCOUNTER — Other Ambulatory Visit: Payer: Self-pay

## 2019-10-01 ENCOUNTER — Ambulatory Visit (INDEPENDENT_AMBULATORY_CARE_PROVIDER_SITE_OTHER): Payer: Medicare Other | Admitting: Family Medicine

## 2019-10-01 DIAGNOSIS — Z23 Encounter for immunization: Secondary | ICD-10-CM | POA: Diagnosis not present

## 2019-10-01 DIAGNOSIS — Z1159 Encounter for screening for other viral diseases: Secondary | ICD-10-CM

## 2019-10-01 DIAGNOSIS — I10 Essential (primary) hypertension: Secondary | ICD-10-CM

## 2019-10-01 DIAGNOSIS — E782 Mixed hyperlipidemia: Secondary | ICD-10-CM

## 2019-10-02 LAB — LIPID PANEL
Chol/HDL Ratio: 3.3 ratio (ref 0.0–4.4)
Cholesterol, Total: 220 mg/dL — ABNORMAL HIGH (ref 100–199)
HDL: 67 mg/dL (ref >39)
LDL Chol Calc (NIH): 133 mg/dL — ABNORMAL HIGH (ref 0–99)
Triglycerides: 112 mg/dL (ref 0–149)
VLDL Cholesterol Cal: 20 mg/dL (ref 5–40)

## 2019-10-02 LAB — CMP14+EGFR
ALT: 21 IU/L (ref 0–32)
AST: 22 IU/L (ref 0–40)
Albumin/Globulin Ratio: 1.4 (ref 1.2–2.2)
Albumin: 4 g/dL (ref 3.7–4.7)
Alkaline Phosphatase: 97 IU/L (ref 39–117)
BUN/Creatinine Ratio: 9 — ABNORMAL LOW (ref 12–28)
BUN: 8 mg/dL (ref 8–27)
Bilirubin Total: 0.5 mg/dL (ref 0.0–1.2)
CO2: 24 mmol/L (ref 20–29)
Calcium: 9.5 mg/dL (ref 8.7–10.3)
Chloride: 104 mmol/L (ref 96–106)
Creatinine, Ser: 0.87 mg/dL (ref 0.57–1.00)
GFR calc Af Amer: 78 mL/min/{1.73_m2} (ref >59)
GFR calc non Af Amer: 67 mL/min/{1.73_m2} (ref >59)
Globulin, Total: 2.9 g/dL (ref 1.5–4.5)
Glucose: 98 mg/dL (ref 65–99)
Potassium: 4.4 mmol/L (ref 3.5–5.2)
Sodium: 142 mmol/L (ref 134–144)
Total Protein: 6.9 g/dL (ref 6.0–8.5)

## 2019-10-02 LAB — HCV AB W/RFLX TO VERIFICATION: HCV Ab: <0.1 s/co ratio (ref 0.0–0.9)

## 2019-10-02 LAB — HCV INTERPRETATION

## 2019-10-21 ENCOUNTER — Encounter: Payer: Self-pay | Admitting: Radiology

## 2019-12-30 ENCOUNTER — Other Ambulatory Visit: Payer: Self-pay | Admitting: Family Medicine

## 2019-12-30 DIAGNOSIS — I10 Essential (primary) hypertension: Secondary | ICD-10-CM

## 2019-12-30 MED ORDER — LISINOPRIL 40 MG PO TABS: 40.0000 mg | ORAL_TABLET | Freq: Every day | ORAL | 0 refills | Status: DC

## 2019-12-30 NOTE — Telephone Encounter (Signed)
Copied from CRM [id number]. Topic: Quick Communication - Rx Refill/Question >> Dec 30, 2019 11:23 AM Marylen Ponto wrote: Medication: lisinopril (ZESTRIL) 40 MG tablet  Has the patient contacted their pharmacy? yes   Preferred Pharmacy (with phone number or street name): [facility] - [place] - [address]  Phone: [phone]  Fax: [phone]  Agent: Please be advised that RX refills may take up to 3 business days. We ask that you follow-up with your pharmacy.

## 2019-12-30 NOTE — Telephone Encounter (Signed)
Requested Prescriptions  Pending Prescriptions Disp Refills  . lisinopril (ZESTRIL) 40 MG tablet 90 tablet 0    Sig: Take 1 tablet (40 mg total) by mouth daily.     Cardiovascular:  ACE Inhibitors Failed - 12/30/2019 11:27 AM      Failed - Last BP in normal range    BP Readings from Last 1 Encounters:  10/16/18 (!) 149/79         Passed - Cr in normal range and within 180 days    Creat  Date Value Ref Range Status  10/20/2016 0.69 0.50 - 0.99 mg/dL Final    Comment:      For patients > or = 72 years of age: The upper reference limit for Creatinine is approximately 13% higher for people identified as African-American.      Creatinine, Ser  Date Value Ref Range Status  10/01/2019 0.87 0.57 - 1.00 mg/dL Final         Passed - K in normal range and within 180 days    Potassium  Date Value Ref Range Status  10/01/2019 4.4 3.5 - 5.2 mmol/L Final         Passed - Patient is not pregnant      Passed - Valid encounter within last 6 months    Recent Outpatient Visits          3 months ago Need for hepatitis C screening test   [facility] at [facility], Manus Rudd, [place]   3 months ago Essential hypertension   Primary Care at [facility], Manus Rudd, [place]   1 year ago Essential hypertension   [facility] at [facility], [place], [place]   2 years ago Mixed hyperlipidemia   [facility] at [place], [place], [place]   3 years ago Encounter for medication monitoring   [facility] at [facility], Manus Rudd, [place]

## 2020-01-16 ENCOUNTER — Telehealth: Payer: Self-pay | Admitting: *Deleted

## 2020-01-16 NOTE — Telephone Encounter (Signed)
Schedule awv  

## 2020-03-23 ENCOUNTER — Telehealth (INDEPENDENT_AMBULATORY_CARE_PROVIDER_SITE_OTHER): Payer: Medicare Other | Admitting: Family Medicine

## 2020-03-23 ENCOUNTER — Other Ambulatory Visit: Payer: Self-pay

## 2020-03-23 DIAGNOSIS — I10 Essential (primary) hypertension: Secondary | ICD-10-CM

## 2020-03-23 MED ORDER — LISINOPRIL 40 MG PO TABS: 40.0000 mg | ORAL_TABLET | Freq: Every day | ORAL | 0 refills | Status: DC

## 2020-03-23 NOTE — Patient Instructions (Signed)
° ° ° °  If you have lab work done today you will be contacted with your lab results within the next 2 weeks.  If you have not heard from us then please contact us. The fastest way to get your results is to register for My Chart. ° ° °IF you received an x-ray today, you will receive an invoice from [facility]. Please contact [facility] at [phone] with questions or concerns regarding your invoice.  ° °IF you received labwork today, you will receive an invoice from [facility]. Please contact [facility] at [phone] with questions or concerns regarding your invoice.  ° °Our billing staff will not be able to assist you with questions regarding bills from these companies. ° °You will be contacted with the lab results as soon as they are available. The fastest way to get your results is to activate your My Chart account. Instructions are located on the last page of this paperwork. If you have not heard from us regarding the results in 2 weeks, please contact this office. °  ° ° ° °

## 2020-03-23 NOTE — Progress Notes (Signed)
Telemedicine Encounter- SOAP NOTE Established Patient  This telephone encounter was conducted with the patient's (or proxy's) verbal consent via audio telecommunications: yes/no: Yes Patient was instructed to have this encounter in a suitably private space; and to only have persons present to whom they give permission to participate. In addition, patient identity was confirmed by use of name plus two identifiers (DOB and address).  I discussed the limitations, risks, security and privacy concerns of performing an evaluation and management service by telephone and the availability of in person appointments. I also discussed with the patient that there may be a patient responsible charge related to this service. The patient expressed understanding and agreed to proceed.  I spent a total of TIME; 0 MIN TO 60 MIN: 15 minutes talking with the patient or their proxy.  Patient is using a flip phone.   Chief Complaint  Patient presents with  . Hypertension    need refill on lisinopril     Subjective   Sally Bautista is a 74 y.o. established patient. Telephone visit today for  HPI  Hypertension: Patient here for follow-up of elevated blood pressure. She is exercising and is adherent to low salt diet.  Blood pressure is well controlled at home. Cardiac symptoms none. Patient denies chest pain, claudication, fatigue and near-syncope.  Cardiovascular risk factors: hypertension. Use of agents associated with hypertension: none. History of target organ damage: none. BP Readings from Last 3 Encounters:  10/16/18 (!) 149/79  10/20/17 (!) 162/78  10/20/16 120/60    Patient Active Problem List   Diagnosis Date Noted  . HYPERGLYCEMIA, FASTING 01/29/2008  . Mixed hyperlipidemia 11/16/2007  . HYPERTENSION 11/16/2007  . OSTEOARTHRITIS 11/16/2007    Past Medical History:  Diagnosis Date  . Allergic rhinitis, cause unspecified    Allergic rhinitis  . Arthritis   . HTN (hypertension)      Current Outpatient Medications  Medication Sig Dispense Refill  . albuterol (VENTOLIN HFA) 108 (90 Base) MCG/ACT inhaler Inhale 2 puffs into the lungs every 6 (six) hours as needed for wheezing. 18 g 2  . aspirin 81 MG tablet Take 81 mg by mouth.     . Cyanocobalamin (VITAMIN B 12 PO) Take by mouth.    . diphenhydrAMINE HCl (BENADRYL ALLERGY PO) Take by mouth.    Marland Kitchen lisinopril (ZESTRIL) 40 MG tablet Take 1 tablet (40 mg total) by mouth daily. 90 tablet 0  . Multiple Vitamin (MULTIVITAMIN) tablet Take 1 tablet by mouth daily.     No current facility-administered medications for this visit.    Allergies  Allergen Reactions  . Codeine Nausea And Vomiting  . Statins   . Sulfonamide Derivatives     Social History   Socioeconomic History  . Marital status: Married    Spouse name: Not on file  . Number of children: Not on file  . Years of education: Not on file  . Highest education level: Not on file  Occupational History  . Not on file  Tobacco Use  . Smoking status: Never Smoker  . Smokeless tobacco: Never Used  Substance and Sexual Activity  . Alcohol use: No  . Drug use: No  . Sexual activity: Not on file  Other Topics Concern  . Not on file  Social History Narrative  . Not on file   Social Determinants of Health   Financial Resource Strain:   . Difficulty of Paying Living Expenses:   Food Insecurity:   . Worried About Estate manager/land agent  of Food in the Last Year:   . Ran Out of Food in the Last Year:   Transportation Needs:   . Lack of Transportation (Medical):   Marland Kitchen Lack of Transportation (Non-Medical):   Physical Activity:   . Days of Exercise per Week:   . Minutes of Exercise per Session:   Stress:   . Feeling of Stress :   Social Connections:   . Frequency of Communication with Friends and Family:   . Frequency of Social Gatherings with Friends and Family:   . Attends Religious Services:   . Active Member of Clubs or Organizations:   . Attends Tax inspector Meetings:   Marland Kitchen Marital Status:   Intimate Partner Violence:   . Fear of Current or Ex-Partner:   . Emotionally Abused:   Marland Kitchen Physically Abused:   . Sexually Abused:     ROS  Objective   NO EXAM PERFORMED FOR THIS PHONE VISIT   Vitals as reported by the patient: There were no vitals filed for this visit.  Sally Bautista was seen today for hypertension.  Diagnoses and all orders for this visit:  Essential hypertension  Patient's blood pressure is at goal of 139/89 or less. Condition is stable. Continue current medications and treatment plan. I recommend that you exercise for 30-45 minutes 5 days a week. I also recommend a balanced diet with fruits and vegetables every day, lean meats, and little fried foods. The DASH diet (you can find this online) is a good example of this.    I discussed the assessment and treatment plan with the patient. The patient was provided an opportunity to ask questions and all were answered. The patient agreed with the plan and demonstrated an understanding of the instructions.   The patient was advised to call back or seek an in-person evaluation if the symptoms worsen or if the condition fails to improve as anticipated.  I provided 15 minutes of non-face-to-face time during this encounter.  Doristine Bosworth, MD  Primary Care at [facility]

## 2020-05-18 ENCOUNTER — Ambulatory Visit: Payer: Medicare Other

## 2020-05-28 ENCOUNTER — Ambulatory Visit (INDEPENDENT_AMBULATORY_CARE_PROVIDER_SITE_OTHER): Payer: Medicare Other | Admitting: Family Medicine

## 2020-05-28 ENCOUNTER — Other Ambulatory Visit: Payer: Self-pay

## 2020-05-28 DIAGNOSIS — I10 Essential (primary) hypertension: Secondary | ICD-10-CM | POA: Diagnosis not present

## 2020-05-29 LAB — CMP14+EGFR
ALT: 23 IU/L (ref 0–32)
AST: 28 IU/L (ref 0–40)
Albumin/Globulin Ratio: 1.4 (ref 1.2–2.2)
Albumin: 4.1 g/dL (ref 3.7–4.7)
Alkaline Phosphatase: 103 IU/L (ref 48–121)
BUN/Creatinine Ratio: 9 — ABNORMAL LOW (ref 12–28)
BUN: 7 mg/dL — ABNORMAL LOW (ref 8–27)
Bilirubin Total: 0.3 mg/dL (ref 0.0–1.2)
CO2: 26 mmol/L (ref 20–29)
Calcium: 10 mg/dL (ref 8.7–10.3)
Chloride: 104 mmol/L (ref 96–106)
Creatinine, Ser: 0.81 mg/dL (ref 0.57–1.00)
GFR calc Af Amer: 85 mL/min/{1.73_m2} (ref >59)
GFR calc non Af Amer: 73 mL/min/{1.73_m2} (ref >59)
Globulin, Total: 2.9 g/dL (ref 1.5–4.5)
Glucose: 79 mg/dL (ref 65–99)
Potassium: 4.3 mmol/L (ref 3.5–5.2)
Sodium: 142 mmol/L (ref 134–144)
Total Protein: 7 g/dL (ref 6.0–8.5)

## 2020-05-29 LAB — LIPID PANEL
Chol/HDL Ratio: 4.3 ratio (ref 0.0–4.4)
Cholesterol, Total: 220 mg/dL — ABNORMAL HIGH (ref 100–199)
HDL: 51 mg/dL (ref >39)
LDL Chol Calc (NIH): 137 mg/dL — ABNORMAL HIGH (ref 0–99)
Triglycerides: 181 mg/dL — ABNORMAL HIGH (ref 0–149)
VLDL Cholesterol Cal: 32 mg/dL (ref 5–40)

## 2020-06-06 DIAGNOSIS — H52223 Regular astigmatism, bilateral: Secondary | ICD-10-CM | POA: Diagnosis not present

## 2020-06-06 DIAGNOSIS — H2513 Age-related nuclear cataract, bilateral: Secondary | ICD-10-CM | POA: Diagnosis not present

## 2020-06-06 DIAGNOSIS — H40029 Open angle with borderline findings, high risk, unspecified eye: Secondary | ICD-10-CM | POA: Diagnosis not present

## 2020-07-06 ENCOUNTER — Other Ambulatory Visit: Payer: Self-pay | Admitting: Family Medicine

## 2020-07-06 DIAGNOSIS — I10 Essential (primary) hypertension: Secondary | ICD-10-CM

## 2020-07-06 MED ORDER — LISINOPRIL 40 MG PO TABS: 40.0000 mg | ORAL_TABLET | Freq: Every day | ORAL | 0 refills | Status: DC

## 2020-07-06 NOTE — Telephone Encounter (Signed)
Copied from CRM [id number]. Topic: Quick Communication - Rx Refill/Question >> Jul 06, 2020  8:16 AM Jaquita Rector A wrote: Medication: lisinopril (ZESTRIL) 40 MG tablet   Has the patient contacted their pharmacy? Yes.   (Agent: If no, request that the patient contact the pharmacy for the refill.) (Agent: If yes, when and what did the pharmacy advise?)  Preferred Pharmacy (with phone number or street name): [facility] - [place], [place] - [address]  Phone:  [phone] Fax:  [phone]     Agent: Please be advised that RX refills may take up to 3 business days. We ask that you follow-up with your pharmacy.

## 2020-08-05 DIAGNOSIS — L814 Other melanin hyperpigmentation: Secondary | ICD-10-CM | POA: Diagnosis not present

## 2020-08-05 DIAGNOSIS — L820 Inflamed seborrheic keratosis: Secondary | ICD-10-CM | POA: Diagnosis not present

## 2020-08-05 DIAGNOSIS — L578 Other skin changes due to chronic exposure to nonionizing radiation: Secondary | ICD-10-CM | POA: Diagnosis not present

## 2020-09-30 ENCOUNTER — Other Ambulatory Visit: Payer: Self-pay

## 2020-09-30 ENCOUNTER — Telehealth: Payer: Self-pay | Admitting: Family Medicine

## 2020-09-30 DIAGNOSIS — I10 Essential (primary) hypertension: Secondary | ICD-10-CM

## 2020-09-30 MED ORDER — LISINOPRIL 40 MG PO TABS: 40.0000 mg | ORAL_TABLET | Freq: Every day | ORAL | 0 refills | Status: AC

## 2020-09-30 NOTE — Telephone Encounter (Signed)
09/30/2020 - PATIENT ONLY HAS 6 PILLS LEFT OF HER LISINOPRIL 40 mg. SHE IS A FORMER PATIENT OF DR. Darrell Jewel. HER TRANSFER OF CARE IS SCHEDULED FOR Thursday 12/17/2020 WITH KELSEA JUST AT 10:20 am. PLEASE CALL HER WHEN THE MEDICINE HAS BEEN CALLED IN. BEST PHONE FOR PATIENT IS: [phone] (CELL)   PHARMACY CHOICE IS: [facility]  [facility]

## 2020-12-17 ENCOUNTER — Encounter: Payer: Medicare Other | Admitting: Family Medicine

## 2021-08-07 ENCOUNTER — Ambulatory Visit
Admission: EM | Admit: 2021-08-07 | Discharge: 2021-08-07 | Disposition: A | Discharge status: Home / Self Care | Payer: Medicare Other | Attending: Emergency Medicine | Admitting: Emergency Medicine

## 2021-08-07 ENCOUNTER — Telehealth: Payer: Self-pay | Admitting: Emergency Medicine

## 2021-08-07 ENCOUNTER — Other Ambulatory Visit: Payer: Self-pay

## 2021-08-07 DIAGNOSIS — U071 COVID-19: Secondary | ICD-10-CM | POA: Diagnosis not present

## 2021-08-07 MED ORDER — NIRMATRELVIR/RITONAVIR (PAXLOVID)TABLET: 3.0000 | ORAL_TABLET | Freq: Two times a day (BID) | ORAL | 0 refills | Status: AC

## 2021-08-07 MED ORDER — CETIRIZINE HCL 10 MG PO CAPS: 10.0000 mg | ORAL_CAPSULE | Freq: Every day | ORAL | 0 refills | Status: DC

## 2021-08-07 MED ORDER — CETIRIZINE HCL 10 MG PO CAPS: 10.0000 mg | ORAL_CAPSULE | Freq: Every day | ORAL | 0 refills | Status: AC

## 2021-08-07 MED ORDER — BENZONATATE 200 MG PO CAPS: 200.0000 mg | ORAL_CAPSULE | Freq: Three times a day (TID) | ORAL | 0 refills | Status: DC | PRN

## 2021-08-07 MED ORDER — BENZONATATE 200 MG PO CAPS: 200.0000 mg | ORAL_CAPSULE | Freq: Three times a day (TID) | ORAL | 0 refills | Status: AC | PRN

## 2021-08-07 MED ORDER — ONDANSETRON 4 MG PO TBDP: 4.0000 mg | ORAL_TABLET | Freq: Three times a day (TID) | ORAL | 0 refills | Status: DC | PRN

## 2021-08-07 MED ORDER — NIRMATRELVIR/RITONAVIR (PAXLOVID)TABLET: 3.0000 | ORAL_TABLET | Freq: Two times a day (BID) | ORAL | 0 refills | Status: DC

## 2021-08-07 MED ORDER — ONDANSETRON 4 MG PO TBDP: 4.0000 mg | ORAL_TABLET | Freq: Three times a day (TID) | ORAL | 0 refills | Status: AC | PRN

## 2021-08-07 NOTE — Discharge Instructions (Addendum)
Begin Paxlovid twice daily for 5 days Zofran dissolved in mouth as needed for nausea/vomiting Daily cetirizine to help with congestion and drainage Tessalon every hours for cough Please follow-up if not improving or worsening

## 2021-08-07 NOTE — ED Provider Notes (Signed)
[facility]    CSN: [id number] Arrival date & time: 08/07/21  1109      History   Chief Complaint Chief Complaint  Patient presents with   Cough   Nasal Congestion   Nausea    HPI Sally Bautista is a 75 y.o. female presenting today for evaluation of COVID symptoms.  Reports associated cough congestion and sore throat.  Is having fevers, fatigue and headache.  Symptoms began 2 days ago.  Reports at home COVID test positive as well as recent COVID exposure.  Has had low-grade fevers.  Productive cough with white sputum.  HPI  Past Medical History:  Diagnosis Date   Allergic rhinitis, cause unspecified    Allergic rhinitis   Arthritis    HTN (hypertension)     Patient Active Problem List   Diagnosis Date Noted   HYPERGLYCEMIA, FASTING 01/29/2008   Mixed hyperlipidemia 11/16/2007   HYPERTENSION 11/16/2007   OSTEOARTHRITIS 11/16/2007    Past Surgical History:  Procedure Laterality Date   CESAREAN SECTION     DILATION AND CURETTAGE OF UTERUS     times 2    OB History   No obstetric history on file.      Home Medications    Prior to Admission medications   Medication Sig Start Date End Date Taking? Authorizing Provider  albuterol (VENTOLIN HFA) 108 (90 Base) MCG/ACT inhaler Inhale 2 puffs into the lungs every 6 (six) hours as needed for wheezing. 09/23/19   Doristine Bosworth, MD  aspirin 81 MG tablet Take 81 mg by mouth.     [provider]  benzonatate (TESSALON) 200 MG capsule Take 1 capsule (200 mg total) by mouth 3 (three) times daily as needed for up to 7 days for cough. 08/07/21 08/14/21  Ricardo Schubach C, PA-C  Cetirizine HCl 10 MG CAPS Take 1 capsule (10 mg total) by mouth daily for 10 days. 08/07/21 08/17/21  Ayaana Biondo C, PA-C  Cyanocobalamin (VITAMIN B 12 PO) Take by mouth.    [provider]  diphenhydrAMINE HCl (BENADRYL ALLERGY PO) Take by mouth.    [provider]  lisinopril (ZESTRIL) 40 MG tablet Take 1  tablet (40 mg total) by mouth daily. 09/30/20   Shade Flood, MD  Multiple Vitamin (MULTIVITAMIN) tablet Take 1 tablet by mouth daily.    [provider]  nirmatrelvir/ritonavir EUA (PAXLOVID) 20 x 150 MG & 10 x 100MG  TABS Take 3 tablets by mouth 2 (two) times daily for 5 days. Take nirmatrelvir (150 mg) two tablets twice daily for 5 days and ritonavir (100 mg) one tablet twice daily for 5 days. 08/07/21 08/12/21  Madilynne Mullan C, PA-C  ondansetron (ZOFRAN ODT) 4 MG disintegrating tablet Take 1 tablet (4 mg total) by mouth every 8 (eight) hours as needed for nausea or vomiting. 08/07/21   Leodis Alcocer, 08/09/21, PA-C    Family History Family History  Problem Relation Age of Onset   Arthritis Mother    Asthma Daughter    Diabetes Brother     Social History Social History   Tobacco Use   Smoking status: Never   Smokeless tobacco: Never  Substance Use Topics   Alcohol use: No   Drug use: No     Allergies   Codeine, Statins, and Sulfonamide derivatives   Review of Systems Review of Systems  Constitutional:  Positive for fatigue and fever. Negative for activity change, appetite change and chills.  HENT:  Positive for congestion, rhinorrhea and  sore throat. Negative for ear pain, sinus pressure and trouble swallowing.   Eyes:  Negative for discharge and redness.  Respiratory:  Positive for cough. Negative for chest tightness and shortness of breath.   Cardiovascular:  Negative for chest pain.  Gastrointestinal:  Positive for nausea and vomiting. Negative for abdominal pain and diarrhea.  Musculoskeletal:  Negative for myalgias.  Skin:  Negative for rash.  Neurological:  Positive for headaches. Negative for dizziness and light-headedness.    Physical Exam Triage Vital Signs ED Triage Vitals  Enc Vitals Group     BP      Pulse      Resp      Temp      Temp src      SpO2      Weight      Height      Head Circumference      Peak Flow      Pain Score      Pain  Loc      Pain Edu?      Excl. in GC?    No data found.  Updated Vital Signs BP 121/62 (BP Location: Left Arm)   Pulse 74   Temp 99.6 F (37.6 C) (Oral)   Resp 18   SpO2 95%   Visual Acuity Right Eye Distance:   Left Eye Distance:   Bilateral Distance:    Right Eye Near:   Left Eye Near:    Bilateral Near:     Physical Exam Vitals and nursing note reviewed.  Constitutional:      Appearance: She is well-developed.     Comments: No acute distress  HENT:     Head: Normocephalic and atraumatic.     Ears:     Comments: Bilateral ears without tenderness to palpation of external auricle, tragus and mastoid, EAC's without erythema or swelling, TM's with good bony landmarks and cone of light. Non erythematous.       Nose: Nose normal.     Mouth/Throat:     Comments: Oral mucosa pink and moist, no tonsillar enlargement or exudate. Posterior pharynx patent and nonerythematous, no uvula deviation or swelling. Normal phonation.  Eyes:     Conjunctiva/sclera: Conjunctivae normal.  Cardiovascular:     Rate and Rhythm: Normal rate.  Pulmonary:     Effort: Pulmonary effort is normal. No respiratory distress.     Comments: Breathing comfortably at rest, CTABL, no wheezing, rales or other adventitious sounds auscultated  Abdominal:     General: There is no distension.  Musculoskeletal:        General: Normal range of motion.     Cervical back: Neck supple.  Skin:    General: Skin is warm and dry.  Neurological:     Mental Status: She is alert and oriented to person, place, and time.     UC Treatments / Results  Labs (all labs ordered are listed, but only abnormal results are displayed) Labs Reviewed - No data to display  EKG   Radiology No results found.  Procedures Procedures (including critical care time)  Medications Ordered in UC Medications - No data to display  Initial Impression / Assessment and Plan / UC Course  I have reviewed the triage vital signs  and the nursing notes.  Pertinent labs & imaging results that were available during my care of the patient were reviewed by me and considered in my medical decision making (see chart for details).     COVID 19  infection-patient wished to proceed with antivirals, pack Slo-Bid prescribed, GFR on file, check drug interactions.  Recommended continued symptomatic and supportive care as well with close monitoring of breathing and symptoms.  Discussed strict return precautions. Patient verbalized understanding and is agreeable with plan.  Final Clinical Impressions(s) / UC Diagnoses   Final diagnoses:  COVID-19     Discharge Instructions      Begin Paxlovid twice daily for 5 days Zofran dissolved in mouth as needed for nausea/vomiting Daily cetirizine to help with congestion and drainage Tessalon every hours for cough Please follow-up if not improving or worsening     ED Prescriptions     Medication Sig Dispense Auth. Provider   nirmatrelvir/ritonavir EUA (PAXLOVID) 20 x 150 MG & 10 x 100MG  TABS Take 3 tablets by mouth 2 (two) times daily for 5 days. Take nirmatrelvir (150 mg) two tablets twice daily for 5 days and ritonavir (100 mg) one tablet twice daily for 5 days. 30 tablet Wai Litt C, PA-C   benzonatate (TESSALON) 200 MG capsule Take 1 capsule (200 mg total) by mouth 3 (three) times daily as needed for up to 7 days for cough. 28 capsule Hooper Petteway C, PA-C   Cetirizine HCl 10 MG CAPS Take 1 capsule (10 mg total) by mouth daily for 10 days. 10 capsule Maigan Bittinger C, PA-C   ondansetron (ZOFRAN ODT) 4 MG disintegrating tablet Take 1 tablet (4 mg total) by mouth every 8 (eight) hours as needed for nausea or vomiting. 20 tablet Dosha Broshears, [place] C, PA-C      PDMP not reviewed this encounter.   Villa Rodriguez, Lew Dawes 08/07/21 1247

## 2021-08-07 NOTE — ED Triage Notes (Signed)
Two day h/o productive cough, nausea, congestion and fatigue. Pt tested positive for covid with at home test last night. Pt is covid vaccinated but not boosted. Has taken one dose of children's motrin.

## 2023-02-03 ENCOUNTER — Other Ambulatory Visit: Payer: Self-pay | Admitting: Family Medicine

## 2023-02-03 DIAGNOSIS — Z136 Encounter for screening for cardiovascular disorders: Secondary | ICD-10-CM

## 2023-03-06 ENCOUNTER — Ambulatory Visit
Admission: RE | Admit: 2023-03-06 | Discharge: 2023-03-06 | Disposition: A | Discharge status: Home / Self Care | Payer: No Typology Code available for payment source | Source: Ambulatory Visit | Attending: Family Medicine | Admitting: Family Medicine

## 2023-03-06 DIAGNOSIS — Z136 Encounter for screening for cardiovascular disorders: Secondary | ICD-10-CM
